# Patient Record
Sex: Female | Born: 1945 | Race: Asian | Hispanic: No | Marital: Married | Smoking: Never smoker
Health system: Southern US, Community
[De-identification: ages and names within clinical notes are randomized; demographics above are authoritative.]

## PROBLEM LIST (undated history)

## (undated) ENCOUNTER — Emergency Department (HOSPITAL_COMMUNITY): Discharge status: Absent without leave | Payer: Medicare Other

## (undated) DIAGNOSIS — Z853 Personal history of malignant neoplasm of breast: Secondary | ICD-10-CM

## (undated) DIAGNOSIS — Z923 Personal history of irradiation: Secondary | ICD-10-CM

## (undated) DIAGNOSIS — F419 Anxiety disorder, unspecified: Secondary | ICD-10-CM

## (undated) DIAGNOSIS — K219 Gastro-esophageal reflux disease without esophagitis: Secondary | ICD-10-CM

## (undated) DIAGNOSIS — Z8719 Personal history of other diseases of the digestive system: Secondary | ICD-10-CM

## (undated) DIAGNOSIS — Z9989 Dependence on other enabling machines and devices: Secondary | ICD-10-CM

## (undated) DIAGNOSIS — T7840XA Allergy, unspecified, initial encounter: Secondary | ICD-10-CM

## (undated) DIAGNOSIS — Z9221 Personal history of antineoplastic chemotherapy: Secondary | ICD-10-CM

## (undated) DIAGNOSIS — E042 Nontoxic multinodular goiter: Secondary | ICD-10-CM

## (undated) DIAGNOSIS — Z86010 Personal history of colonic polyps: Secondary | ICD-10-CM

## (undated) DIAGNOSIS — E785 Hyperlipidemia, unspecified: Secondary | ICD-10-CM

## (undated) DIAGNOSIS — Z860101 Personal history of adenomatous and serrated colon polyps: Secondary | ICD-10-CM

## (undated) DIAGNOSIS — R339 Retention of urine, unspecified: Secondary | ICD-10-CM

## (undated) DIAGNOSIS — G4733 Obstructive sleep apnea (adult) (pediatric): Secondary | ICD-10-CM

## (undated) DIAGNOSIS — Z8619 Personal history of other infectious and parasitic diseases: Secondary | ICD-10-CM

## (undated) DIAGNOSIS — G4730 Sleep apnea, unspecified: Secondary | ICD-10-CM

## (undated) DIAGNOSIS — M810 Age-related osteoporosis without current pathological fracture: Secondary | ICD-10-CM

## (undated) DIAGNOSIS — D509 Iron deficiency anemia, unspecified: Secondary | ICD-10-CM

## (undated) DIAGNOSIS — N819 Female genital prolapse, unspecified: Secondary | ICD-10-CM

## (undated) DIAGNOSIS — Z8679 Personal history of other diseases of the circulatory system: Secondary | ICD-10-CM

## (undated) DIAGNOSIS — R3915 Urgency of urination: Secondary | ICD-10-CM

## (undated) DIAGNOSIS — E039 Hypothyroidism, unspecified: Secondary | ICD-10-CM

## (undated) DIAGNOSIS — N3020 Other chronic cystitis without hematuria: Secondary | ICD-10-CM

## (undated) HISTORY — PX: CARPAL TUNNEL RELEASE: SHX101

## (undated) HISTORY — DX: Sleep apnea, unspecified: G47.30

## (undated) HISTORY — PX: CATARACT EXTRACTION W/ INTRAOCULAR LENS  IMPLANT, BILATERAL: SHX1307

## (undated) HISTORY — PX: OTHER SURGICAL HISTORY: SHX169

## (undated) HISTORY — DX: Personal history of colonic polyps: Z86.010

## (undated) HISTORY — DX: Personal history of antineoplastic chemotherapy: Z92.21

## (undated) HISTORY — DX: Allergy, unspecified, initial encounter: T78.40XA

## (undated) HISTORY — DX: Personal history of adenomatous and serrated colon polyps: Z86.0101

## (undated) HISTORY — DX: Personal history of irradiation: Z92.3

## (undated) HISTORY — DX: Other chronic cystitis without hematuria: N30.20

---

## 1968-09-21 HISTORY — PX: URETHRAL DILATION: SUR417

## 1978-01-21 HISTORY — PX: ABDOMINAL HYSTERECTOMY: SHX81

## 1997-05-03 ENCOUNTER — Ambulatory Visit (HOSPITAL_COMMUNITY): Admission: RE | Admit: 1997-05-03 | Discharge: 1997-05-03 | Payer: Self-pay | Admitting: Gynecology

## 1998-03-23 ENCOUNTER — Encounter: Payer: Self-pay | Admitting: Pulmonary Disease

## 1998-03-23 ENCOUNTER — Ambulatory Visit (HOSPITAL_COMMUNITY): Admission: RE | Admit: 1998-03-23 | Discharge: 1998-03-23 | Payer: Self-pay | Admitting: Pulmonary Disease

## 1999-04-03 ENCOUNTER — Encounter: Payer: Self-pay | Admitting: Gynecology

## 1999-04-03 ENCOUNTER — Ambulatory Visit (HOSPITAL_COMMUNITY): Admission: RE | Admit: 1999-04-03 | Discharge: 1999-04-03 | Payer: Self-pay | Admitting: Gynecology

## 2000-08-21 ENCOUNTER — Ambulatory Visit (HOSPITAL_COMMUNITY): Admission: RE | Admit: 2000-08-21 | Discharge: 2000-08-21 | Payer: Self-pay | Admitting: Gynecology

## 2000-08-21 ENCOUNTER — Encounter: Payer: Self-pay | Admitting: Gynecology

## 2000-12-17 ENCOUNTER — Other Ambulatory Visit: Admission: RE | Admit: 2000-12-17 | Discharge: 2000-12-17 | Payer: Self-pay | Admitting: Gynecology

## 2001-07-02 ENCOUNTER — Encounter: Admission: RE | Admit: 2001-07-02 | Discharge: 2001-07-02 | Payer: Self-pay | Admitting: Gastroenterology

## 2001-07-02 ENCOUNTER — Encounter: Payer: Self-pay | Admitting: Gastroenterology

## 2001-11-17 ENCOUNTER — Ambulatory Visit (HOSPITAL_COMMUNITY): Admission: RE | Admit: 2001-11-17 | Discharge: 2001-11-17 | Payer: Self-pay | Admitting: Internal Medicine

## 2001-11-17 ENCOUNTER — Encounter: Payer: Self-pay | Admitting: Internal Medicine

## 2001-12-11 ENCOUNTER — Encounter: Payer: Self-pay | Admitting: Gastroenterology

## 2002-03-18 ENCOUNTER — Other Ambulatory Visit: Admission: RE | Admit: 2002-03-18 | Discharge: 2002-03-18 | Payer: Self-pay | Admitting: Gynecology

## 2002-09-08 ENCOUNTER — Encounter: Payer: Self-pay | Admitting: Sports Medicine

## 2002-09-08 ENCOUNTER — Encounter: Admission: RE | Admit: 2002-09-08 | Discharge: 2002-09-08 | Payer: Self-pay | Admitting: Sports Medicine

## 2003-01-27 ENCOUNTER — Ambulatory Visit (HOSPITAL_COMMUNITY): Admission: RE | Admit: 2003-01-27 | Discharge: 2003-01-27 | Payer: Self-pay | Admitting: Gynecology

## 2003-03-21 ENCOUNTER — Other Ambulatory Visit: Admission: RE | Admit: 2003-03-21 | Discharge: 2003-03-21 | Payer: Self-pay | Admitting: Gynecology

## 2003-09-20 ENCOUNTER — Ambulatory Visit (HOSPITAL_COMMUNITY): Admission: RE | Admit: 2003-09-20 | Discharge: 2003-09-20 | Payer: Self-pay | Admitting: Gastroenterology

## 2003-12-29 ENCOUNTER — Ambulatory Visit: Payer: Self-pay | Admitting: Internal Medicine

## 2004-03-12 ENCOUNTER — Ambulatory Visit: Payer: Self-pay | Admitting: Internal Medicine

## 2004-03-15 ENCOUNTER — Ambulatory Visit (HOSPITAL_COMMUNITY): Admission: RE | Admit: 2004-03-15 | Discharge: 2004-03-15 | Payer: Self-pay | Admitting: Gynecology

## 2004-03-20 ENCOUNTER — Ambulatory Visit: Payer: Self-pay | Admitting: Internal Medicine

## 2004-03-21 ENCOUNTER — Other Ambulatory Visit: Admission: RE | Admit: 2004-03-21 | Discharge: 2004-03-21 | Payer: Self-pay | Admitting: Gynecology

## 2004-04-12 ENCOUNTER — Ambulatory Visit: Payer: Self-pay | Admitting: Internal Medicine

## 2004-05-01 ENCOUNTER — Ambulatory Visit: Payer: Self-pay | Admitting: Internal Medicine

## 2004-08-11 ENCOUNTER — Encounter: Admission: RE | Admit: 2004-08-11 | Discharge: 2004-08-11 | Payer: Self-pay | Admitting: Orthopedic Surgery

## 2004-09-06 ENCOUNTER — Ambulatory Visit: Payer: Self-pay | Admitting: Internal Medicine

## 2004-11-06 ENCOUNTER — Ambulatory Visit: Payer: Self-pay | Admitting: Internal Medicine

## 2004-11-26 ENCOUNTER — Ambulatory Visit: Payer: Self-pay | Admitting: Internal Medicine

## 2005-03-22 ENCOUNTER — Ambulatory Visit: Payer: Self-pay | Admitting: Internal Medicine

## 2005-03-28 ENCOUNTER — Ambulatory Visit: Payer: Self-pay | Admitting: Internal Medicine

## 2005-03-29 ENCOUNTER — Ambulatory Visit (HOSPITAL_COMMUNITY): Admission: RE | Admit: 2005-03-29 | Discharge: 2005-03-29 | Payer: Self-pay | Admitting: Obstetrics & Gynecology

## 2005-06-10 ENCOUNTER — Ambulatory Visit: Payer: Self-pay | Admitting: Internal Medicine

## 2005-06-22 ENCOUNTER — Encounter: Payer: Self-pay | Admitting: Pulmonary Disease

## 2005-07-08 ENCOUNTER — Ambulatory Visit: Payer: Self-pay | Admitting: Endocrinology

## 2005-07-17 ENCOUNTER — Ambulatory Visit (HOSPITAL_COMMUNITY): Admission: RE | Admit: 2005-07-17 | Discharge: 2005-07-17 | Payer: Self-pay | Admitting: Endocrinology

## 2005-07-29 ENCOUNTER — Ambulatory Visit: Payer: Self-pay | Admitting: Endocrinology

## 2005-08-01 ENCOUNTER — Ambulatory Visit: Payer: Self-pay | Admitting: Endocrinology

## 2005-08-06 ENCOUNTER — Ambulatory Visit: Payer: Self-pay | Admitting: Endocrinology

## 2005-08-29 ENCOUNTER — Ambulatory Visit: Payer: Self-pay | Admitting: Endocrinology

## 2005-09-09 ENCOUNTER — Ambulatory Visit: Payer: Self-pay

## 2005-09-09 ENCOUNTER — Encounter: Payer: Self-pay | Admitting: Cardiovascular Disease

## 2005-09-10 ENCOUNTER — Ambulatory Visit: Payer: Self-pay | Admitting: Endocrinology

## 2005-10-30 ENCOUNTER — Ambulatory Visit: Payer: Self-pay | Admitting: Gastroenterology

## 2005-11-04 ENCOUNTER — Ambulatory Visit: Payer: Self-pay | Admitting: Endocrinology

## 2005-11-04 LAB — CONVERTED CEMR LAB
ALT: 22 units/L (ref 0–40)
Albumin: 3.6 g/dL (ref 3.5–5.2)
CO2: 32 meq/L (ref 19–32)
Calcium: 9.1 mg/dL (ref 8.4–10.5)
Chloride: 105 meq/L (ref 96–112)
Chol/HDL Ratio, serum: 2.4
Creatinine, Ser: 0.9 mg/dL (ref 0.4–1.2)
Glucose, Bld: 110 mg/dL — ABNORMAL HIGH (ref 70–99)
LDL Cholesterol: 56 mg/dL (ref 0–99)
Potassium: 4 meq/L (ref 3.5–5.1)
Sodium: 143 meq/L (ref 135–145)
Total Bilirubin: 0.7 mg/dL (ref 0.3–1.2)
VLDL: 15 mg/dL (ref 0–40)

## 2005-11-06 ENCOUNTER — Ambulatory Visit: Payer: Self-pay | Admitting: Endocrinology

## 2006-01-16 ENCOUNTER — Ambulatory Visit: Payer: Self-pay | Admitting: Cardiology

## 2006-02-03 ENCOUNTER — Ambulatory Visit: Payer: Self-pay | Admitting: Gastroenterology

## 2006-02-03 LAB — CONVERTED CEMR LAB
Fecal Occult Blood: NEGATIVE
OCCULT 1: NEGATIVE
OCCULT 2: NEGATIVE
OCCULT 3: NEGATIVE
OCCULT 4: NEGATIVE
OCCULT 5: NEGATIVE

## 2006-03-13 ENCOUNTER — Ambulatory Visit: Payer: Self-pay | Admitting: Cardiology

## 2006-03-13 ENCOUNTER — Ambulatory Visit: Payer: Self-pay

## 2006-03-13 LAB — CONVERTED CEMR LAB
BUN: 12 mg/dL (ref 6–23)
Calcium: 9.6 mg/dL (ref 8.4–10.5)
GFR calc Af Amer: 109 mL/min
GFR calc non Af Amer: 90 mL/min
Glucose, Bld: 95 mg/dL (ref 70–99)
Magnesium: 2.1 mg/dL (ref 1.5–2.5)
Potassium: 3.7 meq/L (ref 3.5–5.1)
Sodium: 142 meq/L (ref 135–145)

## 2006-04-10 ENCOUNTER — Ambulatory Visit (HOSPITAL_COMMUNITY): Admission: RE | Admit: 2006-04-10 | Discharge: 2006-04-10 | Payer: Self-pay | Admitting: Obstetrics & Gynecology

## 2006-04-14 ENCOUNTER — Ambulatory Visit: Payer: Self-pay | Admitting: Cardiology

## 2006-04-14 ENCOUNTER — Ambulatory Visit: Payer: Self-pay

## 2006-04-14 ENCOUNTER — Encounter: Payer: Self-pay | Admitting: Internal Medicine

## 2006-05-12 ENCOUNTER — Ambulatory Visit: Payer: Self-pay | Admitting: Gastroenterology

## 2006-06-24 ENCOUNTER — Ambulatory Visit: Payer: Self-pay | Admitting: Internal Medicine

## 2006-06-24 LAB — CONVERTED CEMR LAB
Basophils Absolute: 0 K/uL
Basophils Relative: 0.3 %
Eosinophils Absolute: 0.3 K/uL
Eosinophils Relative: 3 %
HCT: 34.9 % — ABNORMAL LOW
Hemoglobin: 12.2 g/dL
Iron: 80 ug/dL
Lymphocytes Relative: 40.9 %
MCHC: 34.9 g/dL
MCV: 90.3 fL
Monocytes Absolute: 0.7 K/uL
Monocytes Relative: 7.7 %
Neutro Abs: 4.7 K/uL
Neutrophils Relative %: 48.1 %
Platelets: 289 K/uL
RBC: 3.86 M/uL — ABNORMAL LOW
RDW: 13.2 %
Saturation Ratios: 20.2 %
Transferrin: 282.8 mg/dL
Vitamin B-12: 483 pg/mL
WBC: 9.6 10*3/microliter

## 2006-07-07 ENCOUNTER — Ambulatory Visit: Payer: Self-pay | Admitting: Internal Medicine

## 2006-07-09 ENCOUNTER — Ambulatory Visit: Payer: Self-pay | Admitting: Internal Medicine

## 2006-07-09 ENCOUNTER — Encounter: Payer: Self-pay | Admitting: Internal Medicine

## 2006-07-14 ENCOUNTER — Ambulatory Visit: Payer: Self-pay | Admitting: Endocrinology

## 2006-09-25 ENCOUNTER — Ambulatory Visit: Payer: Self-pay | Admitting: Endocrinology

## 2006-09-25 LAB — CONVERTED CEMR LAB
ALT: 27 units/L (ref 0–35)
AST: 23 units/L (ref 0–37)
Albumin: 3.8 g/dL (ref 3.5–5.2)
Basophils Absolute: 0 10*3/uL (ref 0.0–0.1)
Bilirubin Urine: NEGATIVE
Bilirubin, Direct: 0.1 mg/dL (ref 0.0–0.3)
Cholesterol: 125 mg/dL (ref 0–200)
Crystals: NEGATIVE
Eosinophils Absolute: 0.3 10*3/uL (ref 0.0–0.6)
GFR calc non Af Amer: 90 mL/min
Glucose, Bld: 114 mg/dL — ABNORMAL HIGH (ref 70–99)
HCT: 36.9 % (ref 36.0–46.0)
Hemoglobin: 12.8 g/dL (ref 12.0–15.0)
LDL Cholesterol: 57 mg/dL (ref 0–99)
Lymphocytes Relative: 42.6 % (ref 12.0–46.0)
MCHC: 34.6 g/dL (ref 30.0–36.0)
MCV: 92.5 fL (ref 78.0–100.0)
Monocytes Absolute: 0.7 10*3/uL (ref 0.2–0.7)
Monocytes Relative: 7.8 % (ref 3.0–11.0)
Neutro Abs: 4.1 10*3/uL (ref 1.4–7.7)
PTH: 40.2 pg/mL (ref 14.0–72.0)
Potassium: 3.8 meq/L (ref 3.5–5.1)
Specific Gravity, Urine: 1.015 (ref 1.000–1.03)
Total Bilirubin: 0.9 mg/dL (ref 0.3–1.2)
Urobilinogen, UA: 0.2 (ref 0.0–1.0)
VLDL: 18 mg/dL (ref 0–40)
WBC: 8.9 10*3/uL (ref 4.5–10.5)
pH: 6.5 (ref 5.0–8.0)

## 2006-10-09 ENCOUNTER — Ambulatory Visit: Payer: Self-pay | Admitting: Endocrinology

## 2006-12-22 ENCOUNTER — Telehealth: Payer: Self-pay | Admitting: Endocrinology

## 2006-12-24 ENCOUNTER — Encounter: Payer: Self-pay | Admitting: Pulmonary Disease

## 2007-04-29 ENCOUNTER — Encounter: Payer: Self-pay | Admitting: Internal Medicine

## 2007-06-11 ENCOUNTER — Ambulatory Visit (HOSPITAL_COMMUNITY): Admission: RE | Admit: 2007-06-11 | Discharge: 2007-06-11 | Payer: Self-pay | Admitting: Obstetrics & Gynecology

## 2007-06-13 DIAGNOSIS — F329 Major depressive disorder, single episode, unspecified: Secondary | ICD-10-CM | POA: Insufficient documentation

## 2007-06-13 DIAGNOSIS — IMO0001 Reserved for inherently not codable concepts without codable children: Secondary | ICD-10-CM | POA: Insufficient documentation

## 2007-06-13 DIAGNOSIS — I10 Essential (primary) hypertension: Secondary | ICD-10-CM | POA: Insufficient documentation

## 2007-06-13 DIAGNOSIS — K219 Gastro-esophageal reflux disease without esophagitis: Secondary | ICD-10-CM | POA: Insufficient documentation

## 2007-06-13 DIAGNOSIS — G5600 Carpal tunnel syndrome, unspecified upper limb: Secondary | ICD-10-CM | POA: Insufficient documentation

## 2007-06-13 DIAGNOSIS — K5730 Diverticulosis of large intestine without perforation or abscess without bleeding: Secondary | ICD-10-CM | POA: Insufficient documentation

## 2007-06-13 DIAGNOSIS — E785 Hyperlipidemia, unspecified: Secondary | ICD-10-CM | POA: Insufficient documentation

## 2007-06-13 DIAGNOSIS — D126 Benign neoplasm of colon, unspecified: Secondary | ICD-10-CM | POA: Insufficient documentation

## 2007-06-13 DIAGNOSIS — F3289 Other specified depressive episodes: Secondary | ICD-10-CM | POA: Insufficient documentation

## 2007-06-13 DIAGNOSIS — D509 Iron deficiency anemia, unspecified: Secondary | ICD-10-CM | POA: Insufficient documentation

## 2007-06-13 DIAGNOSIS — J45909 Unspecified asthma, uncomplicated: Secondary | ICD-10-CM | POA: Insufficient documentation

## 2007-06-13 DIAGNOSIS — K449 Diaphragmatic hernia without obstruction or gangrene: Secondary | ICD-10-CM | POA: Insufficient documentation

## 2007-06-13 DIAGNOSIS — E039 Hypothyroidism, unspecified: Secondary | ICD-10-CM | POA: Insufficient documentation

## 2007-06-26 ENCOUNTER — Ambulatory Visit: Payer: Self-pay | Admitting: Internal Medicine

## 2007-06-26 DIAGNOSIS — E042 Nontoxic multinodular goiter: Secondary | ICD-10-CM | POA: Insufficient documentation

## 2007-06-26 DIAGNOSIS — E739 Lactose intolerance, unspecified: Secondary | ICD-10-CM | POA: Insufficient documentation

## 2007-06-26 DIAGNOSIS — F411 Generalized anxiety disorder: Secondary | ICD-10-CM | POA: Insufficient documentation

## 2007-06-26 DIAGNOSIS — J309 Allergic rhinitis, unspecified: Secondary | ICD-10-CM | POA: Insufficient documentation

## 2007-06-29 ENCOUNTER — Encounter (INDEPENDENT_AMBULATORY_CARE_PROVIDER_SITE_OTHER): Payer: Self-pay | Admitting: *Deleted

## 2007-07-15 ENCOUNTER — Ambulatory Visit: Payer: Self-pay | Admitting: Pulmonary Disease

## 2007-07-16 ENCOUNTER — Encounter: Payer: Self-pay | Admitting: Pulmonary Disease

## 2007-07-16 ENCOUNTER — Ambulatory Visit (HOSPITAL_BASED_OUTPATIENT_CLINIC_OR_DEPARTMENT_OTHER): Admission: RE | Admit: 2007-07-16 | Discharge: 2007-07-16 | Payer: Self-pay | Admitting: Pulmonary Disease

## 2007-07-17 ENCOUNTER — Encounter: Payer: Self-pay | Admitting: Cardiology

## 2007-07-29 ENCOUNTER — Telehealth: Payer: Self-pay | Admitting: Pulmonary Disease

## 2007-08-04 ENCOUNTER — Telehealth (INDEPENDENT_AMBULATORY_CARE_PROVIDER_SITE_OTHER): Payer: Self-pay | Admitting: *Deleted

## 2007-08-05 ENCOUNTER — Ambulatory Visit: Payer: Self-pay | Admitting: Pulmonary Disease

## 2007-08-06 ENCOUNTER — Ambulatory Visit: Payer: Self-pay | Admitting: Pulmonary Disease

## 2007-08-06 DIAGNOSIS — G4733 Obstructive sleep apnea (adult) (pediatric): Secondary | ICD-10-CM | POA: Insufficient documentation

## 2007-08-10 ENCOUNTER — Telehealth (INDEPENDENT_AMBULATORY_CARE_PROVIDER_SITE_OTHER): Payer: Self-pay | Admitting: *Deleted

## 2007-08-17 ENCOUNTER — Telehealth: Payer: Self-pay | Admitting: Pulmonary Disease

## 2007-09-06 ENCOUNTER — Encounter: Payer: Self-pay | Admitting: Pulmonary Disease

## 2007-09-09 ENCOUNTER — Ambulatory Visit: Payer: Self-pay | Admitting: Pulmonary Disease

## 2007-09-14 ENCOUNTER — Encounter: Payer: Self-pay | Admitting: Endocrinology

## 2007-09-22 ENCOUNTER — Telehealth: Payer: Self-pay | Admitting: Pulmonary Disease

## 2007-10-09 ENCOUNTER — Encounter: Payer: Self-pay | Admitting: Pulmonary Disease

## 2007-11-03 ENCOUNTER — Telehealth: Payer: Self-pay | Admitting: Internal Medicine

## 2007-11-06 ENCOUNTER — Telehealth: Payer: Self-pay | Admitting: Internal Medicine

## 2007-11-09 ENCOUNTER — Encounter: Payer: Self-pay | Admitting: Pulmonary Disease

## 2007-11-10 ENCOUNTER — Telehealth (INDEPENDENT_AMBULATORY_CARE_PROVIDER_SITE_OTHER): Payer: Self-pay | Admitting: *Deleted

## 2007-11-12 ENCOUNTER — Ambulatory Visit: Payer: Self-pay | Admitting: Internal Medicine

## 2007-11-12 ENCOUNTER — Ambulatory Visit: Payer: Self-pay | Admitting: Endocrinology

## 2007-11-12 LAB — CONVERTED CEMR LAB
BUN: 14 mg/dL (ref 6–23)
Creatinine, Ser: 0.7 mg/dL (ref 0.4–1.2)

## 2007-11-14 ENCOUNTER — Encounter: Payer: Self-pay | Admitting: Pulmonary Disease

## 2007-11-20 ENCOUNTER — Encounter: Payer: Self-pay | Admitting: Pulmonary Disease

## 2007-11-23 ENCOUNTER — Telehealth: Payer: Self-pay | Admitting: Internal Medicine

## 2007-12-01 ENCOUNTER — Ambulatory Visit: Payer: Self-pay | Admitting: Internal Medicine

## 2007-12-01 ENCOUNTER — Ambulatory Visit (HOSPITAL_COMMUNITY): Admission: RE | Admit: 2007-12-01 | Discharge: 2007-12-01 | Payer: Self-pay | Admitting: Internal Medicine

## 2007-12-02 ENCOUNTER — Ambulatory Visit: Payer: Self-pay | Admitting: Pulmonary Disease

## 2007-12-09 ENCOUNTER — Ambulatory Visit: Payer: Self-pay | Admitting: Endocrinology

## 2007-12-09 LAB — CONVERTED CEMR LAB
ALT: 23 units/L (ref 0–35)
AST: 22 units/L (ref 0–37)
Alkaline Phosphatase: 43 units/L (ref 39–117)
BUN: 11 mg/dL (ref 6–23)
Basophils Absolute: 0 10*3/uL (ref 0.0–0.1)
Calcium: 9.2 mg/dL (ref 8.4–10.5)
Chloride: 105 meq/L (ref 96–112)
Cholesterol: 112 mg/dL (ref 0–200)
Crystals: NEGATIVE
Eosinophils Absolute: 0.3 10*3/uL (ref 0.0–0.7)
Eosinophils Relative: 3.5 % (ref 0.0–5.0)
GFR calc non Af Amer: 90 mL/min
Glucose, Bld: 106 mg/dL — ABNORMAL HIGH (ref 70–99)
Ketones, ur: NEGATIVE mg/dL
LDL Cholesterol: 46 mg/dL (ref 0–99)
Lymphocytes Relative: 38.4 % (ref 12.0–46.0)
MCV: 93.4 fL (ref 78.0–100.0)
Mucus, UA: NEGATIVE
Neutro Abs: 4.1 10*3/uL (ref 1.4–7.7)
Neutrophils Relative %: 48.9 % (ref 43.0–77.0)
Potassium: 3.6 meq/L (ref 3.5–5.1)
RBC: 3.82 M/uL — ABNORMAL LOW (ref 3.87–5.11)
RDW: 13.7 % (ref 11.5–14.6)
Sodium: 142 meq/L (ref 135–145)
Specific Gravity, Urine: 1.02 (ref 1.000–1.03)
Squamous Epithelial / LPF: NEGATIVE /lpf
Total Bilirubin: 0.7 mg/dL (ref 0.3–1.2)
Total Protein: 7 g/dL (ref 6.0–8.3)
Triglycerides: 62 mg/dL (ref 0–149)
Urine Glucose: NEGATIVE mg/dL
Urobilinogen, UA: 0.2 (ref 0.0–1.0)
VLDL: 12 mg/dL (ref 0–40)

## 2007-12-14 ENCOUNTER — Ambulatory Visit: Payer: Self-pay | Admitting: Pulmonary Disease

## 2007-12-23 ENCOUNTER — Encounter: Payer: Self-pay | Admitting: Pulmonary Disease

## 2007-12-23 ENCOUNTER — Telehealth (INDEPENDENT_AMBULATORY_CARE_PROVIDER_SITE_OTHER): Payer: Self-pay | Admitting: *Deleted

## 2007-12-23 ENCOUNTER — Ambulatory Visit (HOSPITAL_COMMUNITY): Admission: RE | Admit: 2007-12-23 | Discharge: 2007-12-23 | Payer: Self-pay | Admitting: Pulmonary Disease

## 2007-12-24 ENCOUNTER — Ambulatory Visit: Payer: Self-pay | Admitting: Endocrinology

## 2007-12-24 ENCOUNTER — Ambulatory Visit: Payer: Self-pay | Admitting: Pulmonary Disease

## 2007-12-24 DIAGNOSIS — N3020 Other chronic cystitis without hematuria: Secondary | ICD-10-CM | POA: Insufficient documentation

## 2008-05-24 ENCOUNTER — Encounter: Payer: Self-pay | Admitting: Endocrinology

## 2008-06-06 ENCOUNTER — Telehealth: Payer: Self-pay | Admitting: Pulmonary Disease

## 2008-06-08 ENCOUNTER — Encounter: Payer: Self-pay | Admitting: Endocrinology

## 2008-06-13 ENCOUNTER — Ambulatory Visit: Payer: Self-pay | Admitting: Pulmonary Disease

## 2008-06-15 ENCOUNTER — Ambulatory Visit: Payer: Self-pay | Admitting: Pulmonary Disease

## 2008-06-15 ENCOUNTER — Ambulatory Visit: Admission: RE | Admit: 2008-06-15 | Discharge: 2008-06-15 | Payer: Self-pay | Admitting: Pulmonary Disease

## 2008-07-14 ENCOUNTER — Encounter: Payer: Self-pay | Admitting: Pulmonary Disease

## 2008-09-14 ENCOUNTER — Ambulatory Visit: Payer: Self-pay | Admitting: Pulmonary Disease

## 2008-09-28 ENCOUNTER — Ambulatory Visit: Payer: Self-pay | Admitting: Endocrinology

## 2008-10-03 ENCOUNTER — Telehealth: Payer: Self-pay | Admitting: Pulmonary Disease

## 2008-10-03 ENCOUNTER — Encounter: Payer: Self-pay | Admitting: Pulmonary Disease

## 2008-11-02 ENCOUNTER — Ambulatory Visit: Payer: Self-pay | Admitting: Endocrinology

## 2008-11-17 ENCOUNTER — Telehealth: Payer: Self-pay | Admitting: Internal Medicine

## 2008-11-18 ENCOUNTER — Ambulatory Visit: Payer: Self-pay | Admitting: Endocrinology

## 2008-11-18 DIAGNOSIS — R51 Headache: Secondary | ICD-10-CM | POA: Insufficient documentation

## 2008-11-18 DIAGNOSIS — M810 Age-related osteoporosis without current pathological fracture: Secondary | ICD-10-CM | POA: Insufficient documentation

## 2008-11-18 DIAGNOSIS — R519 Headache, unspecified: Secondary | ICD-10-CM | POA: Insufficient documentation

## 2008-11-18 LAB — CONVERTED CEMR LAB
Albumin: 3.9 g/dL (ref 3.5–5.2)
Alkaline Phosphatase: 61 units/L (ref 39–117)
BUN: 17 mg/dL (ref 6–23)
Basophils Relative: 0.9 % (ref 0.0–3.0)
Calcium: 9.1 mg/dL (ref 8.4–10.5)
Eosinophils Absolute: 0.2 10*3/uL (ref 0.0–0.7)
GFR calc non Af Amer: 89.15 mL/min (ref >60)
Glucose, Bld: 129 mg/dL — ABNORMAL HIGH (ref 70–99)
HCT: 36.2 % (ref 36.0–46.0)
Ketones, ur: NEGATIVE mg/dL
LDL Cholesterol: 104 mg/dL — ABNORMAL HIGH (ref 0–99)
Lymphocytes Relative: 40.4 % (ref 12.0–46.0)
MCHC: 34.6 g/dL (ref 30.0–36.0)
MCV: 93.7 fL (ref 78.0–100.0)
Monocytes Relative: 6.3 % (ref 3.0–12.0)
Neutrophils Relative %: 49.8 % (ref 43.0–77.0)
PTH: 24.3 pg/mL (ref 14.0–72.0)
Platelets: 292 10*3/uL (ref 150.0–400.0)
RBC: 3.86 M/uL — ABNORMAL LOW (ref 3.87–5.11)
RDW: 13.3 % (ref 11.5–14.6)
Specific Gravity, Urine: 1.01 (ref 1.000–1.030)
Total Bilirubin: 0.6 mg/dL (ref 0.3–1.2)
Total CHOL/HDL Ratio: 4
Total CK: 244 units/L — ABNORMAL HIGH (ref 7–177)
Transferrin: 274.2 mg/dL (ref 212.0–360.0)
Triglycerides: 136 mg/dL (ref 0.0–149.0)
Urine Glucose: NEGATIVE mg/dL
Urobilinogen, UA: 0.2 (ref 0.0–1.0)
pH: 6 (ref 5.0–8.0)

## 2008-11-23 ENCOUNTER — Telehealth: Payer: Self-pay | Admitting: Endocrinology

## 2008-11-28 ENCOUNTER — Telehealth: Payer: Self-pay | Admitting: Endocrinology

## 2009-01-03 ENCOUNTER — Telehealth: Payer: Self-pay | Admitting: Endocrinology

## 2009-01-03 DIAGNOSIS — R3129 Other microscopic hematuria: Secondary | ICD-10-CM | POA: Insufficient documentation

## 2009-01-17 ENCOUNTER — Encounter (INDEPENDENT_AMBULATORY_CARE_PROVIDER_SITE_OTHER): Payer: Self-pay | Admitting: *Deleted

## 2009-01-18 ENCOUNTER — Telehealth (INDEPENDENT_AMBULATORY_CARE_PROVIDER_SITE_OTHER): Payer: Self-pay | Admitting: *Deleted

## 2009-01-19 ENCOUNTER — Telehealth (INDEPENDENT_AMBULATORY_CARE_PROVIDER_SITE_OTHER): Payer: Self-pay | Admitting: *Deleted

## 2009-02-21 ENCOUNTER — Encounter: Payer: Self-pay | Admitting: Endocrinology

## 2009-02-22 ENCOUNTER — Encounter: Payer: Self-pay | Admitting: Endocrinology

## 2009-03-13 ENCOUNTER — Ambulatory Visit: Payer: Self-pay | Admitting: Endocrinology

## 2009-03-13 DIAGNOSIS — E876 Hypokalemia: Secondary | ICD-10-CM | POA: Insufficient documentation

## 2009-03-13 LAB — CONVERTED CEMR LAB
ALT: 39 units/L — ABNORMAL HIGH (ref 0–35)
Albumin: 3.7 g/dL (ref 3.5–5.2)
Chloride: 104 meq/L (ref 96–112)
Cholesterol: 142 mg/dL (ref 0–200)
Creatinine, Ser: 1 mg/dL (ref 0.4–1.2)
GFR calc non Af Amer: 59.01 mL/min (ref >60)
Glucose, Bld: 106 mg/dL — ABNORMAL HIGH (ref 70–99)
HDL: 64.5 mg/dL (ref >39.00)
LDL Cholesterol: 67 mg/dL (ref 0–99)
Potassium: 3.8 meq/L (ref 3.5–5.1)
Total Bilirubin: 0.6 mg/dL (ref 0.3–1.2)
Total CHOL/HDL Ratio: 2
Total CK: 959 units/L — ABNORMAL HIGH (ref 7–177)
VLDL: 10.4 mg/dL (ref 0.0–40.0)

## 2009-04-11 ENCOUNTER — Ambulatory Visit: Payer: Self-pay | Admitting: Endocrinology

## 2009-04-12 LAB — CONVERTED CEMR LAB
AST: 21 units/L (ref 0–37)
Alkaline Phosphatase: 56 units/L (ref 39–117)
Bilirubin, Direct: 0.1 mg/dL (ref 0.0–0.3)
Total Bilirubin: 0.3 mg/dL (ref 0.3–1.2)
Total Protein: 6.9 g/dL (ref 6.0–8.3)

## 2009-04-13 ENCOUNTER — Ambulatory Visit (HOSPITAL_COMMUNITY): Admission: RE | Admit: 2009-04-13 | Discharge: 2009-04-13 | Payer: Self-pay | Admitting: Obstetrics & Gynecology

## 2009-04-21 ENCOUNTER — Encounter: Admission: RE | Admit: 2009-04-21 | Discharge: 2009-04-21 | Payer: Self-pay | Admitting: Obstetrics & Gynecology

## 2009-04-21 HISTORY — PX: BREAST LUMPECTOMY: SHX2

## 2009-05-11 ENCOUNTER — Encounter: Admission: RE | Admit: 2009-05-11 | Discharge: 2009-05-11 | Payer: Self-pay | Admitting: Obstetrics & Gynecology

## 2009-05-15 ENCOUNTER — Encounter: Admission: RE | Admit: 2009-05-15 | Discharge: 2009-05-15 | Payer: Self-pay | Admitting: Obstetrics & Gynecology

## 2009-05-16 ENCOUNTER — Encounter: Payer: Self-pay | Admitting: Endocrinology

## 2009-05-17 ENCOUNTER — Encounter: Payer: Self-pay | Admitting: Endocrinology

## 2009-05-25 ENCOUNTER — Encounter: Payer: Self-pay | Admitting: Endocrinology

## 2009-05-25 ENCOUNTER — Encounter (INDEPENDENT_AMBULATORY_CARE_PROVIDER_SITE_OTHER): Payer: Self-pay | Admitting: *Deleted

## 2009-05-25 LAB — CONVERTED CEMR LAB
AST: 24 units/L
Calcium: 10 mg/dL
GFR calc Af Amer: >60 mL/min

## 2009-05-26 ENCOUNTER — Encounter (INDEPENDENT_AMBULATORY_CARE_PROVIDER_SITE_OTHER): Payer: Self-pay | Admitting: *Deleted

## 2009-05-29 HISTORY — PX: OTHER SURGICAL HISTORY: SHX169

## 2009-06-05 ENCOUNTER — Ambulatory Visit: Payer: Self-pay | Admitting: Oncology

## 2009-06-07 ENCOUNTER — Encounter: Payer: Self-pay | Admitting: Endocrinology

## 2009-06-07 LAB — COMPREHENSIVE METABOLIC PANEL
ALT: 15 U/L (ref 0–35)
AST: 19 U/L (ref 0–37)
Albumin: 4 g/dL (ref 3.5–5.2)
Alkaline Phosphatase: 60 U/L (ref 39–117)
BUN: 12 mg/dL (ref 6–23)
CO2: 24 mEq/L (ref 19–32)
Calcium: 9 mg/dL (ref 8.4–10.5)
Chloride: 102 mEq/L (ref 96–112)
Creatinine, Ser: 0.69 mg/dL (ref 0.40–1.20)
Glucose, Bld: 99 mg/dL (ref 70–99)
Potassium: 3.8 mEq/L (ref 3.5–5.3)
Sodium: 142 mEq/L (ref 135–145)
Total Bilirubin: 0.4 mg/dL (ref 0.3–1.2)
Total Protein: 6.9 g/dL (ref 6.0–8.3)

## 2009-06-07 LAB — CBC WITH DIFFERENTIAL/PLATELET
BASO%: 1.1 % (ref 0.0–2.0)
EOS%: 2.5 % (ref 0.0–7.0)
HCT: 35.1 % (ref 34.8–46.6)
LYMPH%: 36.4 % (ref 14.0–49.7)
MCH: 30.5 pg (ref 25.1–34.0)
MCHC: 33 g/dL (ref 31.5–36.0)
MONO%: 9.1 % (ref 0.0–14.0)
NEUT%: 50.9 % (ref 38.4–76.8)
Platelets: 276 10*3/uL (ref 145–400)
lymph#: 3.3 10*3/uL (ref 0.9–3.3)

## 2009-06-12 ENCOUNTER — Ambulatory Visit (HOSPITAL_COMMUNITY): Admission: RE | Admit: 2009-06-12 | Discharge: 2009-06-12 | Payer: Self-pay | Admitting: Oncology

## 2009-06-12 ENCOUNTER — Ambulatory Visit: Payer: Self-pay | Admitting: Endocrinology

## 2009-06-12 ENCOUNTER — Telehealth: Payer: Self-pay | Admitting: Endocrinology

## 2009-06-12 LAB — CONVERTED CEMR LAB: TSH: 2.14 microintl units/mL (ref 0.35–5.50)

## 2009-06-21 ENCOUNTER — Ambulatory Visit (HOSPITAL_COMMUNITY): Admission: RE | Admit: 2009-06-21 | Discharge: 2009-06-21 | Payer: Self-pay | Admitting: Oncology

## 2009-06-23 ENCOUNTER — Encounter: Payer: Self-pay | Admitting: Endocrinology

## 2009-06-26 ENCOUNTER — Telehealth: Payer: Self-pay | Admitting: Endocrinology

## 2009-07-03 ENCOUNTER — Encounter: Payer: Self-pay | Admitting: Endocrinology

## 2009-07-04 ENCOUNTER — Ambulatory Visit: Payer: Self-pay | Admitting: Pulmonary Disease

## 2009-07-04 DIAGNOSIS — C50919 Malignant neoplasm of unspecified site of unspecified female breast: Secondary | ICD-10-CM | POA: Insufficient documentation

## 2009-07-05 ENCOUNTER — Ambulatory Visit: Payer: Self-pay | Admitting: Oncology

## 2009-07-11 ENCOUNTER — Encounter: Payer: Self-pay | Admitting: Endocrinology

## 2009-07-13 ENCOUNTER — Encounter: Payer: Self-pay | Admitting: Endocrinology

## 2009-07-13 ENCOUNTER — Telehealth: Payer: Self-pay | Admitting: Endocrinology

## 2009-07-13 ENCOUNTER — Ambulatory Visit (HOSPITAL_COMMUNITY): Admission: RE | Admit: 2009-07-13 | Discharge: 2009-07-13 | Payer: Self-pay | Admitting: Oncology

## 2009-07-13 ENCOUNTER — Telehealth: Payer: Self-pay | Admitting: Internal Medicine

## 2009-07-13 LAB — CBC WITH DIFFERENTIAL/PLATELET
Eosinophils Absolute: 0.2 10*3/uL (ref 0.0–0.5)
MONO#: 0.6 10*3/uL (ref 0.1–0.9)
NEUT#: 0 10*3/uL — CL (ref 1.5–6.5)
RBC: 3.97 10*6/uL (ref 3.70–5.45)
RDW: 13.8 % (ref 11.2–14.5)
WBC: 2.5 10*3/uL — ABNORMAL LOW (ref 3.9–10.3)
lymph#: 1.8 10*3/uL (ref 0.9–3.3)
nRBC: 0 % (ref 0–0)

## 2009-07-13 LAB — BASIC METABOLIC PANEL
Chloride: 99 mEq/L (ref 96–112)
Creatinine, Ser: 0.78 mg/dL (ref 0.40–1.20)
Potassium: 3 mEq/L — ABNORMAL LOW (ref 3.5–5.3)
Sodium: 136 mEq/L (ref 135–145)

## 2009-07-14 ENCOUNTER — Encounter: Payer: Self-pay | Admitting: Internal Medicine

## 2009-07-14 LAB — MANUAL DIFFERENTIAL
Band Neutrophils: 15 % — ABNORMAL HIGH (ref 0–10)
EOS: 1 % (ref 0–7)
Other Cell: 0 % (ref 0–0)
PROMYELO: 0 % (ref 0–0)
SEG: 10 % — ABNORMAL LOW (ref 38–77)
nRBC: 3 % — ABNORMAL HIGH (ref 0–0)

## 2009-07-14 LAB — CBC WITH DIFFERENTIAL/PLATELET
HCT: 35.6 % (ref 34.8–46.6)
HGB: 12.1 g/dL (ref 11.6–15.9)
MCH: 30.1 pg (ref 25.1–34.0)
MCV: 88.6 fL (ref 79.5–101.0)
Platelets: 205 10*3/uL (ref 145–400)
nRBC: 1 % — ABNORMAL HIGH (ref 0–0)

## 2009-07-19 LAB — CULTURE, BLOOD (SINGLE)

## 2009-07-28 ENCOUNTER — Encounter: Payer: Self-pay | Admitting: Endocrinology

## 2009-07-28 LAB — COMPREHENSIVE METABOLIC PANEL
ALT: 30 U/L (ref 0–35)
AST: 27 U/L (ref 0–37)
Alkaline Phosphatase: 53 U/L (ref 39–117)
BUN: 18 mg/dL (ref 6–23)
Creatinine, Ser: 0.72 mg/dL (ref 0.40–1.20)
Potassium: 4 mEq/L (ref 3.5–5.3)

## 2009-07-28 LAB — CBC WITH DIFFERENTIAL/PLATELET
Basophils Absolute: 0 10*3/uL (ref 0.0–0.1)
EOS%: 0 % (ref 0.0–7.0)
HCT: 33.2 % — ABNORMAL LOW (ref 34.8–46.6)
HGB: 11.1 g/dL — ABNORMAL LOW (ref 11.6–15.9)
LYMPH%: 10.1 % — ABNORMAL LOW (ref 14.0–49.7)
MCH: 30.1 pg (ref 25.1–34.0)
MCHC: 33.4 g/dL (ref 31.5–36.0)
MCV: 90 fL (ref 79.5–101.0)
MONO%: 1.9 % (ref 0.0–14.0)
NEUT%: 87.9 % — ABNORMAL HIGH (ref 38.4–76.8)

## 2009-07-30 IMAGING — NM NM HEPATO W/GB/PHARM/[PERSON_NAME]
2 series · 12 of 12 positions shown · non-contrast
Comparison: 09/20/2003 hepatobiliary scan

CLINICAL DATA: NUCLEAR MEDICINE HEPATOBILIARY IMAGING WITH GALLBLADDER EF
TECHNIQUE: Sequential images of the abdomen were obtained [DATE]
minutes following intravenous administration of
radiopharmaceutical.  After slow intravenous infusion of  uCg
Cholecystokinin, gallbladder ejection fraction was determined.

Radiopharmaceutical:  5.4 mCi Tc-77m Choletec
1.6 mcg of CCK IV

[hepato · 2.40mm/px · 6 of 30 frames shown (1 of 2)]
[frame 3/30]
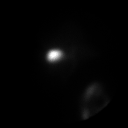
[frame 8/30]
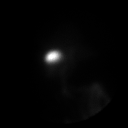
[frame 13/30]
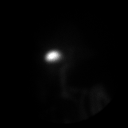
[frame 18/30]
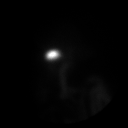
[frame 23/30]
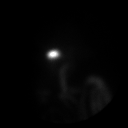
[frame 28/30]
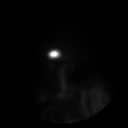

[hepato · 2.40mm/px · 6 of 60 frames shown (2 of 2)]
[frame 6/60]
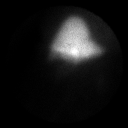
[frame 16/60]
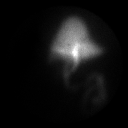
[frame 26/60]
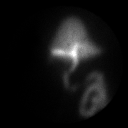
[frame 36/60]
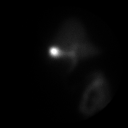
[frame 46/60]
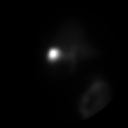
[frame 56/60]
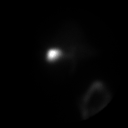

[12 of 12 positions shown; findings below may reference images not displayed]

FINDINGS: Prompt visualization of the biliary ducts and
gallbladder.  Tracer than enters the small bowel.  The gallbladder
ejection fraction is 58.3%.  Normal value is greater than 30%.

The patient did not experience symptoms during CCK infusion.
IMPRESSION: Patency of the biliary and cystic ducts.  Gallbladder ejection
fraction is 58%.

## 2009-07-31 ENCOUNTER — Telehealth: Payer: Self-pay | Admitting: Endocrinology

## 2009-08-01 ENCOUNTER — Ambulatory Visit: Payer: Self-pay | Admitting: Endocrinology

## 2009-08-02 ENCOUNTER — Encounter: Payer: Self-pay | Admitting: Endocrinology

## 2009-08-04 ENCOUNTER — Ambulatory Visit: Payer: Self-pay | Admitting: Oncology

## 2009-08-04 ENCOUNTER — Encounter: Payer: Self-pay | Admitting: Endocrinology

## 2009-08-04 LAB — CBC WITH DIFFERENTIAL/PLATELET
MCH: 29.9 pg (ref 25.1–34.0)
MCHC: 33.2 g/dL (ref 31.5–36.0)
RBC: 3.64 10*6/uL — ABNORMAL LOW (ref 3.70–5.45)
RDW: 15.6 % — ABNORMAL HIGH (ref 11.2–14.5)
nRBC: 1 % — ABNORMAL HIGH (ref 0–0)

## 2009-08-04 LAB — MANUAL DIFFERENTIAL
Band Neutrophils: 0 % (ref 0–10)
Basophil: 0 % (ref 0–2)
EOS: 7 % (ref 0–7)
Metamyelocytes: 0 % (ref 0–0)
Myelocytes: 0 % (ref 0–0)
PLT EST: ADEQUATE
PROMYELO: 0 % (ref 0–0)
nRBC: 0 % (ref 0–0)

## 2009-08-18 ENCOUNTER — Encounter: Payer: Self-pay | Admitting: Endocrinology

## 2009-08-18 LAB — URINALYSIS, MICROSCOPIC - CHCC
Ketones: NEGATIVE mg/dL
Nitrite: NEGATIVE
Protein: NEGATIVE mg/dL
pH: 7 (ref 4.6–8.0)

## 2009-08-18 LAB — COMPREHENSIVE METABOLIC PANEL
Albumin: 4.2 g/dL (ref 3.5–5.2)
BUN: 13 mg/dL (ref 6–23)
CO2: 19 mEq/L (ref 19–32)
Glucose, Bld: 121 mg/dL — ABNORMAL HIGH (ref 70–99)
Potassium: 4.2 mEq/L (ref 3.5–5.3)
Sodium: 141 mEq/L (ref 135–145)
Total Bilirubin: 0.4 mg/dL (ref 0.3–1.2)
Total Protein: 6.7 g/dL (ref 6.0–8.3)

## 2009-08-18 LAB — CBC WITH DIFFERENTIAL/PLATELET
BASO%: 0.4 % (ref 0.0–2.0)
Basophils Absolute: 0.1 10*3/uL (ref 0.0–0.1)
EOS%: 0 % (ref 0.0–7.0)
HGB: 10.5 g/dL — ABNORMAL LOW (ref 11.6–15.9)
MCH: 31.4 pg (ref 25.1–34.0)
MCHC: 34.1 g/dL (ref 31.5–36.0)
RDW: 17.7 % — ABNORMAL HIGH (ref 11.2–14.5)
WBC: 13.2 10*3/uL — ABNORMAL HIGH (ref 3.9–10.3)
lymph#: 1.5 10*3/uL (ref 0.9–3.3)

## 2009-08-25 ENCOUNTER — Encounter: Payer: Self-pay | Admitting: Internal Medicine

## 2009-08-25 LAB — COMPREHENSIVE METABOLIC PANEL
ALT: 24 U/L (ref 0–35)
Alkaline Phosphatase: 63 U/L (ref 39–117)
CO2: 27 mEq/L (ref 19–32)
Creatinine, Ser: 0.62 mg/dL (ref 0.40–1.20)
Glucose, Bld: 109 mg/dL — ABNORMAL HIGH (ref 70–99)
Sodium: 138 mEq/L (ref 135–145)
Total Bilirubin: 0.6 mg/dL (ref 0.3–1.2)
Total Protein: 6.7 g/dL (ref 6.0–8.3)

## 2009-08-25 LAB — CBC WITH DIFFERENTIAL/PLATELET
HCT: 33.6 % — ABNORMAL LOW (ref 34.8–46.6)
HGB: 11.1 g/dL — ABNORMAL LOW (ref 11.6–15.9)
MCH: 29.5 pg (ref 25.1–34.0)
MCHC: 33 g/dL (ref 31.5–36.0)
MCV: 89.4 fL (ref 79.5–101.0)
Platelets: 208 10*3/uL (ref 145–400)

## 2009-08-25 LAB — MANUAL DIFFERENTIAL
Band Neutrophils: 6 % (ref 0–10)
Basophil: 2 % (ref 0–2)
EOS: 7 % (ref 0–7)
LYMPH: 46 % (ref 14–49)
Other Cell: 0 % (ref 0–0)
PLT EST: ADEQUATE
PROMYELO: 2 % — ABNORMAL HIGH (ref 0–0)
SEG: 6 % — ABNORMAL LOW (ref 38–77)
nRBC: 5 % — ABNORMAL HIGH (ref 0–0)

## 2009-09-06 ENCOUNTER — Ambulatory Visit: Payer: Self-pay | Admitting: Oncology

## 2009-09-08 ENCOUNTER — Encounter: Payer: Self-pay | Admitting: Endocrinology

## 2009-09-08 LAB — CBC WITH DIFFERENTIAL/PLATELET
Eosinophils Absolute: 0 10*3/uL (ref 0.0–0.5)
HCT: 30.8 % — ABNORMAL LOW (ref 34.8–46.6)
LYMPH%: 12.6 % — ABNORMAL LOW (ref 14.0–49.7)
MCV: 92.8 fL (ref 79.5–101.0)
MONO#: 1.2 10*3/uL — ABNORMAL HIGH (ref 0.1–0.9)
MONO%: 9.3 % (ref 0.0–14.0)
NEUT#: 9.9 10*3/uL — ABNORMAL HIGH (ref 1.5–6.5)
NEUT%: 78 % — ABNORMAL HIGH (ref 38.4–76.8)
Platelets: 364 10*3/uL (ref 145–400)
WBC: 12.7 10*3/uL — ABNORMAL HIGH (ref 3.9–10.3)

## 2009-09-08 LAB — COMPREHENSIVE METABOLIC PANEL
ALT: 23 U/L (ref 0–35)
Albumin: 4.2 g/dL (ref 3.5–5.2)
BUN: 15 mg/dL (ref 6–23)
CO2: 22 mEq/L (ref 19–32)
Calcium: 9.3 mg/dL (ref 8.4–10.5)
Chloride: 110 mEq/L (ref 96–112)
Creatinine, Ser: 0.63 mg/dL (ref 0.40–1.20)
Potassium: 4.2 mEq/L (ref 3.5–5.3)

## 2009-09-12 ENCOUNTER — Ambulatory Visit: Admission: RE | Admit: 2009-09-12 | Discharge: 2009-12-09 | Payer: Self-pay | Admitting: Radiation Oncology

## 2009-09-15 ENCOUNTER — Encounter: Payer: Self-pay | Admitting: Endocrinology

## 2009-09-15 LAB — COMPREHENSIVE METABOLIC PANEL
Albumin: 3.5 g/dL (ref 3.5–5.2)
Alkaline Phosphatase: 53 U/L (ref 39–117)
BUN: 14 mg/dL (ref 6–23)
CO2: 25 mEq/L (ref 19–32)
Calcium: 8.7 mg/dL (ref 8.4–10.5)
Chloride: 106 mEq/L (ref 96–112)
Glucose, Bld: 139 mg/dL — ABNORMAL HIGH (ref 70–99)
Potassium: 4 mEq/L (ref 3.5–5.3)
Sodium: 140 mEq/L (ref 135–145)
Total Protein: 5.8 g/dL — ABNORMAL LOW (ref 6.0–8.3)

## 2009-09-15 LAB — CBC WITH DIFFERENTIAL/PLATELET
HCT: 32.2 % — ABNORMAL LOW (ref 34.8–46.6)
Platelets: 208 10*3/uL (ref 145–400)
RBC: 3.5 10*6/uL — ABNORMAL LOW (ref 3.70–5.45)
WBC: 2.1 10*3/uL — ABNORMAL LOW (ref 3.9–10.3)

## 2009-09-15 LAB — MANUAL DIFFERENTIAL
ALC: 1.5 10*3/uL (ref 0.9–3.3)
ANC (CHCC manual diff): 0.1 10*3/uL — CL (ref 1.5–6.5)
Blasts: 2 % — ABNORMAL HIGH (ref 0–0)
MONO: 13 % (ref 0–14)
Metamyelocytes: 2 % — ABNORMAL HIGH (ref 0–0)
Myelocytes: 2 % — ABNORMAL HIGH (ref 0–0)
Other Cell: 0 % (ref 0–0)
SEG: 2 % — ABNORMAL LOW (ref 38–77)
Variant Lymph: 0 % (ref 0–0)

## 2009-09-20 LAB — COMPREHENSIVE METABOLIC PANEL
AST: 26 U/L (ref 0–37)
Albumin: 3.3 g/dL — ABNORMAL LOW (ref 3.5–5.2)
Alkaline Phosphatase: 49 U/L (ref 39–117)
BUN: 12 mg/dL (ref 6–23)
Calcium: 8.8 mg/dL (ref 8.4–10.5)
Chloride: 109 mEq/L (ref 96–112)
Glucose, Bld: 170 mg/dL — ABNORMAL HIGH (ref 70–99)
Potassium: 4.1 mEq/L (ref 3.5–5.3)
Sodium: 144 mEq/L (ref 135–145)
Total Protein: 6 g/dL (ref 6.0–8.3)

## 2009-09-20 LAB — CBC WITH DIFFERENTIAL/PLATELET
Basophils Absolute: 0 10*3/uL (ref 0.0–0.1)
Eosinophils Absolute: 0 10*3/uL (ref 0.0–0.5)
HGB: 10.4 g/dL — ABNORMAL LOW (ref 11.6–15.9)
MCV: 92.8 fL (ref 79.5–101.0)
MONO%: 6.5 % (ref 0.0–14.0)
NEUT#: 5 10*3/uL (ref 1.5–6.5)
RBC: 3.49 10*6/uL — ABNORMAL LOW (ref 3.70–5.45)
RDW: 19.8 % — ABNORMAL HIGH (ref 11.2–14.5)
WBC: 7.3 10*3/uL (ref 3.9–10.3)
lymph#: 1.7 10*3/uL (ref 0.9–3.3)

## 2009-09-28 ENCOUNTER — Ambulatory Visit: Payer: Self-pay

## 2009-09-28 ENCOUNTER — Ambulatory Visit: Payer: Self-pay | Admitting: Endocrinology

## 2009-09-28 ENCOUNTER — Telehealth: Payer: Self-pay | Admitting: Endocrinology

## 2009-09-28 DIAGNOSIS — R609 Edema, unspecified: Secondary | ICD-10-CM | POA: Insufficient documentation

## 2009-10-02 ENCOUNTER — Telehealth: Payer: Self-pay | Admitting: Endocrinology

## 2009-10-02 ENCOUNTER — Telehealth (INDEPENDENT_AMBULATORY_CARE_PROVIDER_SITE_OTHER): Payer: Self-pay | Admitting: *Deleted

## 2009-10-16 ENCOUNTER — Ambulatory Visit (HOSPITAL_COMMUNITY): Admission: RE | Admit: 2009-10-16 | Discharge: 2009-10-16 | Payer: Self-pay | Admitting: Endocrinology

## 2009-10-16 ENCOUNTER — Encounter: Payer: Self-pay | Admitting: Endocrinology

## 2009-10-16 ENCOUNTER — Ambulatory Visit: Payer: Self-pay | Admitting: Internal Medicine

## 2009-10-16 ENCOUNTER — Ambulatory Visit: Payer: Self-pay

## 2009-10-16 HISTORY — PX: TRANSTHORACIC ECHOCARDIOGRAM: SHX275

## 2009-10-17 ENCOUNTER — Ambulatory Visit: Payer: Self-pay | Admitting: Cardiology

## 2009-10-20 ENCOUNTER — Telehealth: Payer: Self-pay | Admitting: Cardiology

## 2009-10-23 ENCOUNTER — Telehealth: Payer: Self-pay | Admitting: Endocrinology

## 2009-11-06 ENCOUNTER — Telehealth: Payer: Self-pay | Admitting: Cardiology

## 2009-11-07 ENCOUNTER — Encounter: Admission: RE | Admit: 2009-11-07 | Discharge: 2009-11-07 | Payer: Self-pay | Admitting: Oncology

## 2009-11-08 ENCOUNTER — Ambulatory Visit: Payer: Self-pay | Admitting: Endocrinology

## 2009-11-08 ENCOUNTER — Ambulatory Visit: Payer: Self-pay | Admitting: Oncology

## 2009-11-08 ENCOUNTER — Ambulatory Visit: Payer: Self-pay | Admitting: Cardiology

## 2009-11-09 LAB — CONVERTED CEMR LAB
BUN: 13 mg/dL (ref 6–23)
Bilirubin Urine: NEGATIVE
CO2: 31 meq/L (ref 19–32)
Chloride: 110 meq/L (ref 96–112)
Creatinine, Ser: 0.6 mg/dL (ref 0.4–1.2)
Glucose, Bld: 94 mg/dL (ref 70–99)
Nitrite: NEGATIVE
Potassium: 4.7 meq/L (ref 3.5–5.1)
Total Protein, Urine: NEGATIVE mg/dL
Urobilinogen, UA: 0.2 (ref 0.0–1.0)
pH: 6.5 (ref 5.0–8.0)

## 2009-11-10 LAB — CBC WITH DIFFERENTIAL/PLATELET
Basophils Absolute: 0 10*3/uL (ref 0.0–0.1)
EOS%: 2.7 % (ref 0.0–7.0)
Eosinophils Absolute: 0.2 10*3/uL (ref 0.0–0.5)
HCT: 33.7 % — ABNORMAL LOW (ref 34.8–46.6)
HGB: 11.3 g/dL — ABNORMAL LOW (ref 11.6–15.9)
MCH: 30.6 pg (ref 25.1–34.0)
MCV: 91.1 fL (ref 79.5–101.0)
MONO%: 12.6 % (ref 0.0–14.0)
NEUT#: 3.3 10*3/uL (ref 1.5–6.5)
NEUT%: 54.3 % (ref 38.4–76.8)
Platelets: 281 10*3/uL (ref 145–400)

## 2009-11-10 LAB — COMPREHENSIVE METABOLIC PANEL
AST: 18 U/L (ref 0–37)
Albumin: 4 g/dL (ref 3.5–5.2)
Alkaline Phosphatase: 66 U/L (ref 39–117)
BUN: 14 mg/dL (ref 6–23)
Calcium: 9.3 mg/dL (ref 8.4–10.5)
Chloride: 106 mEq/L (ref 96–112)
Creatinine, Ser: 0.63 mg/dL (ref 0.40–1.20)
Glucose, Bld: 88 mg/dL (ref 70–99)

## 2009-11-11 LAB — CONVERTED CEMR LAB: Albumin: 4 g/dL (ref 3.5–5.2)

## 2009-11-15 ENCOUNTER — Encounter: Payer: Self-pay | Admitting: Endocrinology

## 2010-01-05 ENCOUNTER — Encounter: Payer: Self-pay | Admitting: Internal Medicine

## 2010-01-12 ENCOUNTER — Ambulatory Visit: Payer: Self-pay | Admitting: Oncology

## 2010-01-16 ENCOUNTER — Encounter: Payer: Self-pay | Admitting: Endocrinology

## 2010-01-17 LAB — COMPREHENSIVE METABOLIC PANEL
Alkaline Phosphatase: 73 U/L (ref 39–117)
CO2: 24 mEq/L (ref 19–32)
Creatinine, Ser: 0.62 mg/dL (ref 0.40–1.20)
Glucose, Bld: 86 mg/dL (ref 70–99)
Sodium: 142 mEq/L (ref 135–145)
Total Bilirubin: 0.3 mg/dL (ref 0.3–1.2)
Total Protein: 7 g/dL (ref 6.0–8.3)

## 2010-01-17 LAB — VITAMIN D 25 HYDROXY (VIT D DEFICIENCY, FRACTURES): Vit D, 25-Hydroxy: 33 ng/mL (ref 30–89)

## 2010-02-11 ENCOUNTER — Encounter: Payer: Self-pay | Admitting: Obstetrics & Gynecology

## 2010-02-11 ENCOUNTER — Encounter: Payer: Self-pay | Admitting: Internal Medicine

## 2010-02-20 NOTE — Letter (Signed)
Summary: [facility]  [facility]   Imported By: Lester Fairland 09/29/2009 11:04:53  _____________________________________________________________________  External Attachment:    Type:   Image     Comment:   External Document

## 2010-02-20 NOTE — Progress Notes (Signed)
Summary: BP & Chemo?  Phone Note Call from Patient Call back at [facility] Phone [phone]   Caller: Patient Summary of Call: pt called stating that her BP decreases with Chemo therapy. Pt is requesting an adjustment of BP meds or dosage, please advise Initial call taken by: Margaret Pyle, CMA,  July 31, 2009 9:48 AM  Follow-up for Phone Call        decrease hyzaar to 1/2 tab once daily ov next week Follow-up by: Minus Breeding MD,  July 31, 2009 12:09 PM  Additional Follow-up for Phone Call Additional follow up Details #1::        Pt advised and will call back to sch appt with SAE next week Additional Follow-up by: Margaret Pyle, CMA,  July 31, 2009 1:34 PM    New/Updated Medications: HYZAAR 50-12.5 MG TABS (LOSARTAN POTASSIUM-HCTZ) 1/2 tab once daily Prescriptions: HYZAAR 50-12.5 MG TABS (LOSARTAN POTASSIUM-HCTZ) 1/2 tab once daily  #15 x 11   Entered and Authorized by:   Minus Breeding MD   Signed by:   Minus Breeding MD on 07/31/2009   Method used:   Electronically to        [facility]  [phone]* (retail)       [address]       [place], [place]  [address]       Ph: [phone] or [phone]       Fax: [phone]   RxID:   [id number]

## 2010-02-20 NOTE — Miscellaneous (Signed)
Summary: Appointment Canceled  Appointment status changed to canceled by [employer] on 08/09/2009 9:14 AM.  Cancellation Comments --------------------- appt @ 4:30/echo/pre chemo  Appointment Information ----------------------- Appt Type:  CARDIOLOGY ANCILLARY VISIT      Date:  Wednesday, August 09, 2009      Time:  5:30 PM for 60 min   Urgency:  Routine   Made By:  Pearson Grippe  To Visit:  [id number]    Reason:  appt @ 4:30/echo/pre chemo  Appt Comments ------------- -- 08/09/09 9:14: ([facility]) CANCELED -- appt @ 4:30/echo/pre chemo -- 08/02/09 12:00: ([facility]) BOOKED -- Routine CARDIOLOGY ANCILLARY VISIT at 08/09/2009 5:30 PM for 60 min appt @ 4:30/echo/pre chemo

## 2010-02-20 NOTE — Letter (Signed)
[facility]   Imported By: Sherian Rein 08/14/2009 14:40:53  _____________________________________________________________________  External Attachment:    Type:   Image     Comment:   External Document

## 2010-02-20 NOTE — Letter (Signed)
[facility]   Imported By: Sherian Rein 08/08/2009 09:34:55  _____________________________________________________________________  External Attachment:    Type:   Image     Comment:   External Document

## 2010-02-20 NOTE — Progress Notes (Signed)
Summary: PT ? ON BLOOD WORK   Phone Note Call from Patient   Caller: Patient Reason for Call: Talk to Nurse Summary of Call: PT WANTS KNOW IF SHE NEEDS ANY BLOOD WORK DONE BEFORE SHE SEE DR. Riley Kill. Initial call taken by: Roe Coombs,  November 06, 2009 9:40 AM  Follow-up for Phone Call        Left message to call back. Julieta Gutting, RN, BSN  November 06, 2009 10:27 AM  Liver function checked today by health department. The pt does still have some swelling but only when she stands on her feet for a long period of time.  The pt said Dr Riley Kill had mentioned checking a protein level and one other test at her last appt. She would like to get this done prior to her appt if needed.  Follow-up by: Julieta Gutting, RN, BSN,  November 06, 2009 3:05 PM  Additional Follow-up for Phone Call Additional follow up Details #1::        Per Dr Riley Kill he recommends a serum albumin, UA, and BMP. Julieta Gutting, RN, BSN  November 06, 2009 6:44 PM  Left message for pt to call back. Julieta Gutting, RN, BSN  November 07, 2009 10:29 AM  I spoke with the pt and she will have labs today at the [place] office.  Additional Follow-up by: Julieta Gutting, RN, BSN,  November 08, 2009 9:48 AM

## 2010-02-20 NOTE — Progress Notes (Signed)
Summary: BP meds  Phone Note Call from Patient Call back at Home Phone [phone]   Caller: Patient Summary of Call: Pt called stating that she has been monitoring her BP and it tends to be elevated more often. Pt wants to re-start previous BP med or at a lower dose but wants MD's advisement. Initial call taken by: Margaret Pyle, CMA,  October 20, 2009 10:35 AM  Follow-up for Phone Call        dr Riley Kill is following the swelling, a closely related problem, and last saw you just 2 days ago.  could you please re-direct the question to him? Follow-up by: Minus Breeding MD,  October 20, 2009 11:03 AM  Additional Follow-up for Phone Call Additional follow up Details #1::        One could consider Hyzaar 50/12.5 one half daily.  She took it before.  Edema is trace.  Her LV is normal with mild diastolic abnormality.  This is a general type issue.  She will need a BMET before starting, and one week after starting.  She previously was on K supplement as well.  Thirty minutes reviewing chart.  Additional Follow-up by: Ronaldo Miyamoto, MD, [facility],  October 23, 2009 10:51 AM     Appended Document: BP meds I called the pt and she has already spoken with Dr Everardo All today and has decided to hold off on starting BP meds.  See phone note in chart from Dr Everardo All, done today.

## 2010-02-20 NOTE — Assessment & Plan Note (Signed)
Summary: flu vac sae  stc   Nurse Visit   Allergies: 1)  ! Pcn  Orders Added: 1)  Flu Vaccine 51yrs + MEDICARE PATIENTS [Q2039] 2)  Administration Flu vaccine - MCR [G0008] .lbmedflu   Flu Vaccine Consent Questions     Do you have a history of severe allergic reactions to this vaccine? no    Any prior history of allergic reactions to egg and/or gelatin? no    Do you have a sensitivity to the preservative Thimersol? no    Do you have a past history of Guillan-Barre Syndrome? no    Do you currently have an acute febrile illness? no    Have you ever had a severe reaction to latex? no    Vaccine information given and explained to patient? yes    Are you currently pregnant? no    Lot Number:[id number]   Exp Date:07/21/2010   Site Given  Left Deltoid IM Lanier Prude, [facility])  November 08, 2009 11:42 AM

## 2010-02-20 NOTE — Assessment & Plan Note (Signed)
Summary: cough-fever-body aches chills--stc   Vital Signs:  Patient profile:   65 year old female Height:      62 inches (157.48 cm) Weight:      177.38 pounds (80.63 kg) O2 Sat:      94 % on Room air Temp:     100.9 degrees F (38.28 degrees C) oral Pulse rate:   98 / minute BP sitting:   116 / 66  (left arm) Cuff size:   regular  Vitals Entered By: Josph Macho RMA (March 13, 2009 9:24 AM)  O2 Flow:  Room air CC: Cough, fever, body aches, chills x3days/ pt stated she started Mucinex DM over the weekend/ pt states she is not taking Tamiflu/ CF   Referring Provider:  [place] Callas Primary Provider:  Everardo All  CC:  Cough, fever, body aches, and chills x3days/ pt stated she started Mucinex DM over the weekend/ pt states she is not taking Tamiflu/ CF.  History of Present Illness: pt states 3 days of moderate myalgias of the arms and legs, chills, and associated cough. she wants a cheaper alternative to micardis-hct.   Current Medications (verified): 1)  Albuterol 90 Mcg/act Aers (Albuterol) .... Inhale 2 Puff As Directed Every Six Hours 2)  Alprazolam 0.25 Mg Tabs (Alprazolam) .... Take 1 Tablet By Mouth Once A Day 3)  Flonase 50 Mcg/act Susp (Fluticasone Propionate) .... Inhale 1 Puff As Directed Once A Day 4)  Tamiflu 75 Mg Caps (Oseltamivir Phosphate) .... Take 1 Capsule By Mouth As Directed 5)  Levoxyl 25 Mcg  Tabs (Levothyroxine Sodium) .Marland Kitchen.. 1po Once Daily 6)  Micardis Hct 40-12.5 Mg  Tabs (Telmisartan-Hctz) .Marland Kitchen.. 1 By Mouth Once Daily 7)  Nexium 40 Mg  Cpdr (Esomeprazole Magnesium) .... Take 1 Tablet By Mouth Once A Day 8)  Caltrate 600+d 600-400 Mg-Unit  Tabs (Calcium Carbonate-Vitamin D) .... Take 1 Tablet By Mouth Once A Day 9)  Singulair 10 Mg  Tabs (Montelukast Sodium) .... One By Mouth Daily As Needed 10)  Vitamin D 1000 Unit Tabs (Cholecalciferol) .... Take 1 Tablet By Mouth Once A Day 11)  Centrum Silver  Tabs (Multiple Vitamins-Minerals) .... Take 1 Tablet By Mouth  Once A Day 12)  Motrin Ib 200 Mg Tabs (Ibuprofen) .... As Directed 13)  Pravastatin Sodium 80 Mg Tabs (Pravastatin Sodium) .Marland Kitchen.. 1 Qhs 14)  Klor-Con M10 10 Meq Cr-Tabs (Potassium Chloride Crys Cr) .Marland Kitchen.. 1 Qd  Allergies (verified): 1)  ! Pcn  Past History:  Past Medical History: Last updated: 11/12/2007 HIATAL HERNIA (ICD-553.3) COLONIC POLYPS (ICD-211.3) DIVERTICULOSIS, COLON (ICD-562.10) HYPERLIPIDEMIA (ICD-272.4) HYPERTENSION (ICD-401.9) HYPOTHYROIDISM (ICD-244.9) ASTHMA (ICD-493.90) Anxiety DJD - left knee glucose intolerance GERD Anemia-iron deficiency Hypothyroidism Allergic rhinitis multinodular goiter Depression Osteopenia Urinary Tract Infection  Review of Systems       she reports slight wheezing and low-grade fever  Physical Exam  General:  normal appearance.   Head:  head: no deformity eyes: no periorbital swelling, no proptosis external nose and ears are normal mouth: no lesion seen Ears:  both tm's are slightly red Lungs:  few exp wheezes there are a few rales at the left base Additional Exam:  AST                  [H]  39 U/L                      0-37 ALT                  [  H]  39 U/L    Creatine Kinase      [H]  959 U/L      Impression & Recommendations:  Problem # 1:  COUGH (ICD-786.2) due to a flu-like illness  Problem # 2:  myositis mild, prob due to #1  Problem # 3:  HYPERTENSION (ICD-401.9) well-controlled  Problem # 4:  elevated hepatic transaminases prob due to #1  Medications Added to Medication List This Visit: 1)  Hyzaar 50-12.5 Mg Tabs (Losartan potassium-hctz) .Marland Kitchen.. 1 qd 2)  Promethazine-codeine 6.25-10 Mg/27ml Syrp (Promethazine-codeine) .Marland Kitchen.. 1 tsp every 4 hrs as needed cough 3)  Azithromycin 500 Mg Tabs (Azithromycin) .Marland Kitchen.. 1 qd  Other Orders: T-2 View CXR (71020TC) TLB-BMP (Basic Metabolic Panel-BMET) (80048-METABOL) TLB-Hepatic/Liver Function Pnl (80076-HEPATIC) TLB-Lipid Panel (80061-LIPID) TLB-CK Total Only(Creatine  Kinase/CPK) (82550-CK) Prescription Created Electronically (Z6109) Est. Patient Level IV (60454)  Patient Instructions: 1)  change micardis-hct to losartan-hct, 50/12.5, 1/day. 2)  tests are being ordered for you today.  a few days after the test(s), please call [phone] to hear your test results. 3)  phenergan-codeine 1 teaspoon every 4 hrs as needed for cough 4)  azithromycin 500 mg once daily 5)  chest x ray and blood tests today. 6)  loratadine-d (non-prescription) as needed for congestion 7)  (update: i left message on phone-tree:  come to lab next week for recheck of cpk 729.1 and liver panel 573.9) Prescriptions: AZITHROMYCIN 500 MG TABS (AZITHROMYCIN) 1 qd  #6 x 0   Entered and Authorized by:   Minus Breeding MD   Signed by:   Minus Breeding MD on 03/13/2009   Method used:   Electronically to        [facility]  [phone]* (retail)       [address]       [place], [place]  [address]       Ph: [phone] or [phone]       Fax: [phone]   RxID:   [id number] PROMETHAZINE-CODEINE 6.25-10 MG/5ML SYRP (PROMETHAZINE-CODEINE) 1 tsp every 4 hrs as needed cough  #8 oz x 1   Entered and Authorized by:   Minus Breeding MD   Signed by:   Minus Breeding MD on 03/13/2009   Method used:   Print then Give to Patient   RxID:   [id number] HYZAAR 50-12.5 MG TABS (LOSARTAN POTASSIUM-HCTZ) 1 qd  #30 x 11   Entered and Authorized by:   Minus Breeding MD   Signed by:   Minus Breeding MD on 03/13/2009   Method used:   Electronically to        [facility]  [phone]* (retail)       [address]       [place], [place]  [address]       Ph: [phone] or [phone]       Fax: [phone]   RxID:   [id number]   Appended Document: Orders Update     Clinical Lists Changes  Orders: Added new Service order of Prescription Created Electronically [phone]) - Signed

## 2010-02-20 NOTE — Letter (Signed)
Summary: [facility]  [facility]   Imported By: Sherian Rein 03/09/2009 11:50:49  _____________________________________________________________________  External Attachment:    Type:   Image     Comment:   External Document

## 2010-02-20 NOTE — Progress Notes (Signed)
Summary: update   Phone Note Call from Patient Call back at Home Phone [phone]   Caller: Patient Call For: Dr. Juanda Chance Reason for Call: Talk to Nurse Summary of Call: pt wants Dr. Juanda Chance to be aware that she is going through chemo and would like speak to Dr. Juanda Chance about this  Initial call taken by: Vallarie Mare,  July 13, 2009 1:30 PM  Follow-up for Phone Call        Pt wants Dr. Juanda Chance to be aware that she has breast cancer.  had surgery one month ago and is now taking chemotherapy.  takes a treatment every 3 weeks.  wanted to make sure that this will not interfer with her nexium. (Pt aware Dr. Juanda Chance is on vacation) Follow-up by: Ashok Cordia RN,  July 13, 2009 2:00 PM  Additional Follow-up for Phone Call Additional follow up Details #1::        Please continue to take Nexium for GERD Additional Follow-up by: Hart Carwin MD,  July 13, 2009 10:43 PM    Additional Follow-up for Phone Call Additional follow up Details #2::    Pt notified.   Follow-up by: Ashok Cordia RN,  July 17, 2009 9:27 AM

## 2010-02-20 NOTE — Letter (Signed)
[facility]   Imported By: Sherian Rein 09/08/2009 14:14:04  _____________________________________________________________________  External Attachment:    Type:   Image     Comment:   External Document

## 2010-02-20 NOTE — Progress Notes (Signed)
  Phone Note Call from Patient Call back at Home Phone [phone]   Caller: Patient Call For: p Summary of Call: Pt requests a call from Dr George Hugh office as she states Dr Caron Presume told her to call Dr Everardo All regarding bp meds. Pt states she has spoke w/Dr Riley Kill.  Initial call taken by: Verdell Face,  October 23, 2009 12:05 PM  Follow-up for Phone Call        i called pt 10/23/09.  we discussed options.  pt will continue lasix q week, and will hold-off on cozaar for now--i agree. Follow-up by: Minus Breeding MD,  October 23, 2009 2:02 PM    New/Updated Medications: ISONIAZID 300 MG TABS (ISONIAZID) 1 tab once daily

## 2010-02-20 NOTE — Letter (Signed)
Summary: [facility]   Imported By: Lester Bonesteel 06/21/2009 08:38:31  _____________________________________________________________________  External Attachment:    Type:   Image     Comment:   External Document

## 2010-02-20 NOTE — Letter (Signed)
Summary: [facility]  [facility]   Imported By: Sherian Rein 06/28/2009 12:01:54  _____________________________________________________________________  External Attachment:    Type:   Image     Comment:   External Document

## 2010-02-20 NOTE — Letter (Signed)
[facility]   Imported By: Sherian Rein 09/13/2009 14:48:41  _____________________________________________________________________  External Attachment:    Type:   Image     Comment:   External Document

## 2010-02-20 NOTE — Progress Notes (Signed)
Summary: CANCER  Phone Note Call from Patient Call back at Home Phone [phone]   Caller: Patient Summary of Call: PT CALLED TO LET DR Everardo All KNOW THAT SHE HAS BREAST CANCER AND IS GOING THROUGH CHEMO.  SHE WANT ALL OF HER DOCTORS TO BE AWARE. Initial call taken by: Hilarie Fredrickson,  July 13, 2009 1:17 PM  Follow-up for Phone Call        i already heard.  we at [facility] wish you the best.  come in if we can do anything for you. Follow-up by: Minus Breeding MD,  July 13, 2009 2:00 PM  Additional Follow-up for Phone Call Additional follow up Details #1::        left message on machine for pt to return my call. Margaret Pyle, CMA  July 13, 2009 4:23 PM  left message on machine for pt to return my call, Margaret Pyle, CMA  July 14, 2009 2:43 PM     Additional Follow-up for Phone Call Additional follow up Details #2::    I have called pt and left several messages with no return call. Closing phone note until further contact from pt. Follow-up by: Margaret Pyle, CMA,  July 17, 2009 2:33 PM   Appended Document: CANCER pt called back and was advised of above. Per pt she was very sick afterward. Pt was grateful for the call.  DB

## 2010-02-20 NOTE — Progress Notes (Signed)
Summary: BP medicine?  Phone Note Call from Patient Call back at [facility] Phone [phone]   Caller: Patient Summary of Call: Pt called stating that she is concerned about BP medication (pt has re-started medicine). Pt states that she has recently began to notice swelling of her hands and feet but now it is her entire body. Please advise, could this reaction be caused by BP medicine? Initial call taken by: Margaret Pyle, CMA,  September 28, 2009 9:25 AM  Follow-up for Phone Call        please advise ov.  we need to check the swelling. Follow-up by: Minus Breeding MD,  September 28, 2009 9:41 AM  Additional Follow-up for Phone Call Additional follow up Details #1::        Pt stating that she does not have the strength to come into OV, she had her last chemo treatment 5 days ago. Pt is requesting medication change. Additional Follow-up by: Margaret Pyle, CMA,  September 28, 2009 10:07 AM    Additional Follow-up for Phone Call Additional follow up Details #2::    sorry, i can't give advice about bp med without seeing you.  things can change due to your chemo.  i hope you are feeling better. Follow-up by: Minus Breeding MD,  September 28, 2009 10:17 AM  Additional Follow-up for Phone Call Additional follow up Details #3:: Details for Additional Follow-up Action Taken: Pt advised and transferred to sch appt. Appt double book okay's by SAE Additional Follow-up by: Margaret Pyle, CMA,  September 28, 2009 10:23 AM

## 2010-02-20 NOTE — Letter (Signed)
Summary: NP Eval/[facility]  NP Eval/[facility]   Imported By: Sherian Rein 05/26/2009 14:14:14  _____________________________________________________________________  External Attachment:    Type:   Image     Comment:   External Document

## 2010-02-20 NOTE — Assessment & Plan Note (Signed)
Summary: SWELLING/NWS PER TRIAGE/NWS   Vital Signs:  Patient profile:   65 year old female Height:      62 inches (157.48 cm) Weight:      190 pounds (86.36 kg) BMI:     34.88 O2 Sat:      97 % on Room air Temp:     98.7 degrees F (37.06 degrees C) oral Pulse rate:   91 / minute BP sitting:   112 / 64  (left arm) Cuff size:   large  Vitals Entered By: Brenton Grills MA (September 28, 2009 11:20 AM)  O2 Flow:  Room air CC: swelling, tired/aj   Referring Provider:  [place] Callas Primary Provider:  Everardo All  CC:  swelling and tired/aj.  History of Present Illness: pt states few weeks of slight swelling of both legs.  no assoc sob.  she took hyzaar, but this lowered her bp.  she says swelling is worse in the evening.   she is currently in a break between chemotx and radiation.   pt says she was rx'ed k+, but more recent k+ level was normal, and she was tol she could go of the med.  Current Medications (verified): 1)  Albuterol 90 Mcg/act Aers (Albuterol) .... Inhale 2 Puff As Directed Every Six Hours 2)  Levoxyl 25 Mcg  Tabs (Levothyroxine Sodium) .Marland Kitchen.. 1po Once Daily 3)  Nexium 40 Mg  Cpdr (Esomeprazole Magnesium) .... Take 1 Tablet By Mouth Once A Day 4)  Caltrate 600+d 600-400 Mg-Unit  Tabs (Calcium Carbonate-Vitamin D) .... Take 1 Tablet By Mouth Once A Day 5)  Singulair 10 Mg  Tabs (Montelukast Sodium) .... One By Mouth Daily As Needed 6)  Vitamin D 1000 Unit Tabs (Cholecalciferol) .... Take 1 Tablet By Mouth Once A Day 7)  Centrum Silver  Tabs (Multiple Vitamins-Minerals) .... Take 1 Tablet By Mouth Once A Day 8)  Motrin Ib 200 Mg Tabs (Ibuprofen) .... As Directed 9)  Pravastatin Sodium 80 Mg Tabs (Pravastatin Sodium) .Marland Kitchen.. 1 Qhs 10)  Klor-Con M10 10 Meq Cr-Tabs (Potassium Chloride Crys Cr) .Marland Kitchen.. 1 Qd 11)  Hyzaar 50-12.5 Mg Tabs (Losartan Potassium-Hctz) .... 1/2 Tablet Once Daily  Allergies (verified): 1)  ! 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  The patient denies chest pain.    Physical Exam  General:  obese.  no distress  Extremities:  1+ right pedal edema and 1+ left pedal edema.     Impression & Recommendations:  Problem # 1:  HYPERTENSION (ICD-401.9) overcontrolled  Problem # 2:  EDEMA (ICD-782.3) uncertain etiology  Problem # 3:  HYPOKALEMIA (ICD-276.8) Assessment: Improved  Medications Added to Medication List This Visit: 1)  Hyzaar 50-12.5 Mg Tabs (Losartan potassium-hctz) .... 1/2 tablet once daily 2)  Compression Stockings To Knees  .Marland Kitchen.. 782.3  Other Orders: [facility] ([facility]) Est. Patient Level IV ([address])  Patient Instructions: 1)  check  for blood clots in the legs today.   2)  stop losartan-hctz. 3)  ok to stay-off the potassium for now.   4)  wear compression stockings.   5)  Please schedule a follow-up appointment in 1 month. Prescriptions: COMPRESSION STOCKINGS TO KNEES 782.3  #1 pair x 1   Entered and Authorized by:   Minus Breeding MD   Signed  by:   Minus Breeding MD on 09/28/2009   Method used:   Print then Give to Patient   RxID:   [id number]

## 2010-02-20 NOTE — Miscellaneous (Signed)
Summary: Order/[facility]  Order/[facility]   Imported By: Sherian Rein 07/13/2009 10:27:09  _____________________________________________________________________  External Attachment:    Type:   Image     Comment:   External Document

## 2010-02-20 NOTE — Letter (Signed)
[facility]   Imported By: Lester [place] 07/28/2009 09:25:20  _____________________________________________________________________  External Attachment:    Type:   Image     Comment:   External Document

## 2010-02-20 NOTE — Assessment & Plan Note (Signed)
Summary: Swelling  Medications Added * VITAMIN B-6 TABS (PYRIDOXINE HCL) once daily * TB MEDICATION pt unsure of name      Allergies Added:   Visit Type:  Follow-up Referring Rashad Auld:  Benson Callas Primary Jeraldin Fesler:  Everardo All  CC:  gaining weight and edema .  History of Present Illness: She was getting some mild swelling after every chemotherapy.  It has been the worst after the last treatment, and has stayed the same ever since.  Dopplers in the leg were done, and she was told they were ok.  She had an echo yesterday.  She does not watch her salt closely as well.   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  Denies chest pain.  Current Medications (verified): 1)  Albuterol 90 Mcg/act Aers (Albuterol) .... Inhale 2 Puff As Directed Every Six Hours 2)  Levoxyl 25 Mcg  Tabs (Levothyroxine Sodium) .Marland Kitchen.. 1po Once Daily 3)  Nexium 40 Mg  Cpdr (Esomeprazole Magnesium) .... Take 1 Tablet By Mouth Once A Day 4)  Caltrate 600+d 600-400 Mg-Unit  Tabs (Calcium Carbonate-Vitamin D) .... Take 1 Tablet By Mouth Once A Day 5)  Singulair 10 Mg  Tabs (Montelukast Sodium) .... One By Mouth Daily As Needed 6)  Vitamin D 1000 Unit Tabs (Cholecalciferol) .... Take 1 Tablet By Mouth Once A Day 7)  Centrum Silver  Tabs (Multiple Vitamins-Minerals) .... Take 1 Tablet By Mouth Once A Day 8)  Motrin Ib 200 Mg Tabs (Ibuprofen) .... As Directed 9)  Pravastatin Sodium 80 Mg Tabs (Pravastatin Sodium) .Marland Kitchen.. 1 Qhs 10)  Compression Stockings To Knees .Marland Kitchen.. 782.3 11)  Vitamin B-6 Tabs (Pyridoxine Hcl) .... Once Daily 12)  Tb Medication .... Pt Unsure of Name  Allergies (verified): 1)  ! 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) OBSTRUCTIVE SLEEP APNEA (ICD-327.23) FATIGUE (ICD-780.79) OSTEOPENIA (ICD-733.90) GOITER, MULTINODULAR (ICD-241.1) ALLERGIC RHINITIS (ICD-477.9) ANEMIA-IRON DEFICIENCY (ICD-280.9) GLUCOSE INTOLERANCE (ICD-271.3) ANXIETY (ICD-300.00) HYPERSOMNIA (ICD-780.54) MYOFASCIAL PAIN SYNDROME (ICD-729.1) DEPRESSION (ICD-311) CARPAL TUNNEL SYNDROME, BILATERAL (ICD-354.0) IRON DEFICIENCY (ICD-280.9) GERD (ICD-530.81) HIATAL HERNIA (ICD-553.3) COLONIC POLYPS (ICD-211.3) DIVERTICULOSIS, COLON (ICD-562.10) HYPERLIPIDEMIA (ICD-272.4) HYPERTENSION (ICD-401.9) HYPOTHYROIDISM (ICD-244.9) ASTHMA (ICD-493.90)    Past Surgical History: Last updated: 07/04/2009 hysterectomy Carpal tunnel release s/p urethral dilation s/p breast biopsy - neg x 2 Cataract extraction L breast lumpectomy April 2011  Family History: Last updated: 07/15/2007 father with colon cancer 62 yo brother and sister with HTN aunt with thyroid disease son with ulcerative colitis arthritis prostate cancer   asthma: son, mother cancer: maternal grandfather (stomach)  Social History: Last updated: 06/26/2007 Alcohol use-no 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 Head:  normocephalic and atraumatic Eyes:  PERRLA/EOM intact; conjunctiva and lids normal. Neck:  Neck veins not distended.  Lungs:  Clear bilaterally to auscultation and percussion. Heart:  PMI non displaced.  Normal S1 and S2.  No def murmur.     Abdomen:  Bowel sounds positive; abdomen soft and non-tender without masses, organomegaly, or hernias noted. No hepatosplenomegaly. Pulses:  pulses normal in all 4 extremities Extremities:  No clubbing or cyanosis.  Trace edema.  Minimal pit Neurologic:  Alert and oriented x 3.   EKG  Procedure date:  11/16/2009  Findings:      NSR.  Slight delay in R wave progression, probably normal.  Echocardiogram  Procedure date:  10/16/2009  Findings:      - Left ventricle: The cavity size was normal. Wall thickness was       increased in a pattern of mild LVH. Systolic function was       vigorous. The estimated ejection fraction was in the range of 65%       to 70%. Wall motion was normal; there were no regional wall motion       abnormalities. Doppler parameters are consistent with abnormal       left ventricular relaxation (grade 1 diastolic dysfunction).     - Pulmonary arteries: PA peak pressure: 32mm Hg (S).  CXR  Procedure date:  07/13/2009  Findings:       CHEST - 2 VIEW 07/13/2009:    Comparison: Two-view chest x-ray 03/13/2009 and 04/27/2002.    Findings: Heart size normal and stable.  Thoracic aorta tortuous,   unchanged.  Hilar and mediastinal contours otherwise unremarkable.   Lungs clear.  Bronchovascular markings normal.  No pleural   effusions.  Eventration of the right anterior hemidiaphragm,   unchanged.  Degenerative changes throughout the thoracic spine.    IMPRESSION:   No acute cardiopulmonary disease.  No evidence of metastasis.    Read By:  Arnell Sieving,  M.D.   Released By:  Arnell Sieving,  M.D.   [place] of Abdomen  Procedure date:  06/12/2009  Findings:       Right kidney:  Normal in size (10.7 cm) and parenchymal   echogenicity.  No evidence of mass or hydronephrosis.    Left kidney:  Normal in size (11.8 cm) and parenchymal   echogenicity.  No evidence of mass or hydronephrosis.    Abdominal Aorta:  No aneurysm identified.     IMPRESSION:   No hepatic mass.  Diffuse hepatocellular disease - findings   consistent with fatty infiltration.  Suboptimal visualization of   the pancreas.    Read By:  Jonne Ply,  M.D.   Released By:  Jonne Ply,  M.D.   Impression & Recommendations:  Problem # 1:  EDEMA (ICD-782.3) Edema is at best mild, and her jugular veins are not elevated, RV is not enlarged, and their is trace in the lower extremities.  She is not short of breath.  Suggested she keep her legs elevated, reduce her sodium load, and generally watch for now.  Would prefer to avoid diuretics.  She has a small component of diastolic dysfunction noted despite normal EF, and we reviewed this in detail today.  Problem # 2:  HYPERTENSION (ICD-401.9) Currently not on any BP meds, and I think we will need to watch this at present.  With chemotherapy and  impact of that, she may not need any current treatment.  We will reevaluate in one month, and reassess her present situation.  If more edema, she is to call us.  The following medications  were removed from the medication list:    Furosemide 20 Mg Tabs (Furosemide) .Marland Kitchen... 1 once daily  Orders: EKG w/ Interpretation (93000)  Patient Instructions: 1)  Your physician recommends that you schedule a follow-up appointment in: 1 MONTH 2)  Your physician recommends that you continue on your current medications as directed. Please refer to the Current Medication list given to you today. 3)  Please decrease your salt intake.

## 2010-02-20 NOTE — Miscellaneous (Signed)
Summary: Appointment Canceled  Appointment status changed to canceled by [employer] on 08/02/2009 12:00 PM.  Cancellation Comments --------------------- appt @ 4:30/echo/pre chemo  Appointment Information ----------------------- Appt Type:  CARDIOLOGY ANCILLARY VISIT      Date:  Thursday, August 03, 2009      Time:  4:00 PM for 60 min   Urgency:  Routine   Made By:  Pearson Grippe  To Visit:  [id number]    Reason:  appt @ 4:30/echo/pre chemo  Appt Comments ------------- -- 08/02/09 12:00: ([facility]) CANCELED -- appt @ 4:30/echo/pre chemo -- 08/01/09 15:11: ([facility]) BOOKED -- Routine CARDIOLOGY ANCILLARY VISIT at 08/03/2009 4:00 PM for 60 min appt @ 4:30/echo/pre chemo

## 2010-02-20 NOTE — Miscellaneous (Signed)
Summary: Orders Update  Clinical Lists Changes  Orders: Added new Test order of Venous Duplex Lower Extremity (Venous Duplex Lower) - Signed 

## 2010-02-20 NOTE — Progress Notes (Signed)
Summary: SEND LABS TO DR RUBEN  Phone Note Call from Patient Call back at Home Phone [phone]   Caller: Patient Summary of Call: PT IS REQUESTING THAT THE THYROID LAB REPORT BE SENT TO DR Theron Arista RUBEN AT [facility]. Initial call taken by: Hilarie Fredrickson,  Jun 12, 2009 10:04 AM  Follow-up for Phone Call        Pt results not available as she has blood draw 30 minutes before this call. Follow-up by: Margaret Pyle, CMA,  Jun 12, 2009 10:23 AM  Additional Follow-up for Phone Call Additional follow up Details #1::        faxed TSH lab results to [facility] attn Dr Beatris Ship Additional Follow-up by: Margaret Pyle, CMA,  Jun 12, 2009 1:40 PM

## 2010-02-20 NOTE — Assessment & Plan Note (Signed)
Summary: BP HAS DROPPED/ NOT TAKING BP MEDS/NWS   Vital Signs:  Patient profile:   65 year old female Height:      62 inches (157.48 cm) Weight:      183.25 pounds (83.30 kg) BMI:     33.64 O2 Sat:      96 % on Room air Temp:     98.7 degrees F (38.06 degrees C) oral Pulse rate:   96 / minute BP sitting:   122 / 72  (left arm) Cuff size:   regular  Vitals Entered By: Brenton Grills MA (August 01, 2009 1:08 PM)  O2 Flow:  Room air CC: BP is dropping/hasn't taken BP meds in 2 days/aj   Referring Provider:  Redkey Callas Primary Provider:  Everardo All  CC:  BP is dropping/hasn't taken BP meds in 2 days/aj.  History of Present Illness: pt stopped hyzaar each day she has chemotx.  she is due for 2 more treatments.  she last took hyzaar 3 days ago.   she has fatigue, but no weight change.  Current Medications (verified): 1)  Albuterol 90 Mcg/act Aers (Albuterol) .... Inhale 2 Puff As Directed Every Six Hours 2)  Levoxyl 25 Mcg  Tabs (Levothyroxine Sodium) .Marland Kitchen.. 1po Once Daily 3)  Nexium 40 Mg  Cpdr (Esomeprazole Magnesium) .... Take 1 Tablet By Mouth Once A Day 4)  Caltrate 600+d 600-400 Mg-Unit  Tabs (Calcium Carbonate-Vitamin D) .... Take 1 Tablet By Mouth Once A Day 5)  Singulair 10 Mg  Tabs (Montelukast Sodium) .... One By Mouth Daily As Needed 6)  Vitamin D 1000 Unit Tabs (Cholecalciferol) .... Take 1 Tablet By Mouth Once A Day 7)  Centrum Silver  Tabs (Multiple Vitamins-Minerals) .... Take 1 Tablet By Mouth Once A Day 8)  Motrin Ib 200 Mg Tabs (Ibuprofen) .... As Directed 9)  Pravastatin Sodium 80 Mg Tabs (Pravastatin Sodium) .Marland Kitchen.. 1 Qhs 10)  Klor-Con M10 10 Meq Cr-Tabs (Potassium Chloride Crys Cr) .Marland Kitchen.. 1 Qd 11)  Hyzaar 50-12.5 Mg Tabs (Losartan Potassium-Hctz) .... 1/2 Tab Once Daily  Allergies (verified): 1)  ! Pcn  Review of Systems  The patient denies syncope.    Physical Exam  General:  normal appearance.   Lungs:  Clear to auscultation bilaterally. Normal respiratory  effort.  Heart:  Regular rate and rhythm without murmurs or gallops noted. Normal S1,S2.     Impression & Recommendations:  Problem # 1:  HYPERTENSION (ICD-401.9) overcontrolled  Other Orders: Echo Referral (Echo) Est. Patient Level III (16109)  Patient Instructions: 1)  stop losartan-hctz.  at a future date, we may need to resume at 1/2 pill once daily.   2)  check echocardiogram.  you will be called with a day and time for an appointment 3)  Please schedule a physical appointment in 2 months.

## 2010-02-20 NOTE — Progress Notes (Signed)
Summary: SE?  Phone Note Call from Patient Call back at [facility] Phone [phone]   Summary of Call: Pt's daughter called stating that leg swelling is worse now and left leg is numb. Pt is very weak and also has a low grade temp. Pt contacted Chemo to find out if this is a side effect of treatment but was advised to call PCP. Pt wanted to know if MD thibks she would benefit from Cardi eval and low dose BP med? Pt also wanted MD to be aware that she has started Isoniazid 300mg  once daily and Vit B6 25mg  once daily. If posible pt is also requesting a call from MD of a callto daughter who is a MD in ID at [facility] [facility]. Daughter Suzi Hernan [phone] Initial call taken by: Margaret Pyle, CMA,  October 02, 2009 9:54 AM  Follow-up for Phone Call        go to er now. Follow-up by: Minus Breeding MD,  October 02, 2009 10:11 AM  Additional Follow-up for Phone Call Additional follow up Details #1::        Pt states that she discussed this with SAE at [facility] and told him that she was advised that this is a side effect of Chemo. Pt is requesting Rx for diuretic and possible Cardiac work up. Please advise. Additional Follow-up by: Margaret Pyle, CMA,  October 02, 2009 11:07 AM    Additional Follow-up for Phone Call Additional follow up Details #2::    i have referred to cardiol. if you have fever over 100.5, go to er. Follow-up by: Minus Breeding MD,  October 02, 2009 11:40 AM  Additional Follow-up for Phone Call Additional follow up Details #3:: Details for Additional Follow-up Action Taken: Pt is very upset, stating that is is useless to waitunless 10/17 for eval with Cardiology. Pt states that she is very fatigued, weak and her legs are extremely swollen. Pt is requesting an earlier appt with Dr Tedra Senegal and a call from Dr Everardo All. Margaret Pyle, CMA  October 02, 2009 1:59 PM   New/Updated Medications: FUROSEMIDE 20 MG TABS (FUROSEMIDE) 1 once  daily Prescriptions: FUROSEMIDE 20 MG TABS (FUROSEMIDE) 1 once daily  #30 x 11   Entered and Authorized by:   Minus Breeding MD   Signed by:   Minus Breeding MD on 10/02/2009   Method used:   Electronically to        [facility]  [phone]* (retail)       [address]       [place], [place]  [address]       Ph: [phone] or [phone]       Fax: [phone]   RxID:   [id number]  i called pt 10/02/09.  i sent rx for lasix to pharmacy.  pt agrees. sean ellison, md

## 2010-02-20 NOTE — Letter (Signed)
Summary: [facility]  [facility]   Imported By: Sherian Rein 02/27/2009 10:41:10  _____________________________________________________________________  External Attachment:    Type:   Image     Comment:   External Document

## 2010-02-20 NOTE — Letter (Signed)
[facility]   Imported By: Lester [place] 07/28/2009 09:26:10  _____________________________________________________________________  External Attachment:    Type:   Image     Comment:   External Document

## 2010-02-20 NOTE — Letter (Signed)
[facility]   Imported By: Sherian Rein 07/26/2009 10:14:10  _____________________________________________________________________  External Attachment:    Type:   Image     Comment:   External Document

## 2010-02-20 NOTE — Assessment & Plan Note (Signed)
Summary: rov for osa   Copy to:  [place] Callas Primary Provider/Referring Provider:  Everardo All  CC:  Pt is here for a routine f/u appt.  Pt states she was recently dx with breast cancer- had lumpectomy and is now seeing Dr. Pierce Crane for chemo.  Pt states she is wearing her cpap machine most nights- approx 6 hours per night.  Pt states she needs a new mask- current mask causes sore spot on nose.  Pt does c/o feeling tired and is unsure if this is d/t the cancer or sleep apnea. .  History of Present Illness: the pt comes in today for f/u of her known osa.  She is wearing cpap compliantly, and her latest download shows this.  She has no air leaks of significance, and no significant breakthru apneas.  Everything on the download suggests she is doing well with the cpap.  Despite this, she is feeling "tired", but is very stressed right now because of her recent diagnosis of breast cancer.  She continues to take singulair for her AR symptoms, and thinks this has helped her breathing as well.   Current Medications (verified): 1)  Albuterol 90 Mcg/act Aers (Albuterol) .... Inhale 2 Puff As Directed Every Six Hours 2)  Levoxyl 25 Mcg  Tabs (Levothyroxine Sodium) .Marland Kitchen.. 1po Once Daily 3)  Nexium 40 Mg  Cpdr (Esomeprazole Magnesium) .... Take 1 Tablet By Mouth Once A Day 4)  Caltrate 600+d 600-400 Mg-Unit  Tabs (Calcium Carbonate-Vitamin D) .... Take 1 Tablet By Mouth Once A Day 5)  Singulair 10 Mg  Tabs (Montelukast Sodium) .... One By Mouth Daily As Needed 6)  Vitamin D 1000 Unit Tabs (Cholecalciferol) .... Take 1 Tablet By Mouth Once A Day 7)  Centrum Silver  Tabs (Multiple Vitamins-Minerals) .... Take 1 Tablet By Mouth Once A Day 8)  Motrin Ib 200 Mg Tabs (Ibuprofen) .... As Directed 9)  Pravastatin Sodium 80 Mg Tabs (Pravastatin Sodium) .Marland Kitchen.. 1 Qhs 10)  Klor-Con M10 10 Meq Cr-Tabs (Potassium Chloride Crys Cr) .Marland Kitchen.. 1 Qd 11)  Hyzaar 50-12.5 Mg Tabs (Losartan Potassium-Hctz) .Marland Kitchen.. 1 Qd  Allergies (verified): 1)   ! 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.2) OBSTRUCTIVE SLEEP APNEA (ICD-327.23) FATIGUE (ICD-780.79) OSTEOPENIA (ICD-733.90) GOITER, MULTINODULAR (ICD-241.1) ALLERGIC RHINITIS (ICD-477.9) ANEMIA-IRON DEFICIENCY (ICD-280.9) GLUCOSE INTOLERANCE (ICD-271.3) ANXIETY (ICD-300.00) HYPERSOMNIA (ICD-780.54) MYOFASCIAL PAIN SYNDROME (ICD-729.1) DEPRESSION (ICD-311) CARPAL TUNNEL SYNDROME, BILATERAL (ICD-354.0) IRON DEFICIENCY (ICD-280.9) GERD (ICD-530.81) HIATAL HERNIA (ICD-553.3) COLONIC POLYPS (ICD-211.3) DIVERTICULOSIS, COLON (ICD-562.10) HYPERLIPIDEMIA (ICD-272.4) HYPERTENSION (ICD-401.9) HYPOTHYROIDISM (ICD-244.9) ASTHMA (ICD-493.90)    Past Surgical History: hysterectomy Carpal tunnel release s/p urethral dilation s/p breast biopsy - neg x 2 Cataract extraction L breast lumpectomy April 2011  Vital Signs:  Patient profile:   65 year old female Height:      62 inches Weight:      183 pounds BMI:     33.59 O2 Sat:      96 % on Room air Temp:     98.4 degrees F oral Pulse rate:   74 / minute BP sitting:   124 / 62  (left arm) Cuff size:   regular  Vitals Entered By: Arman Filter LPN (July 04, 2009 11:23 AM)  O2 Flow:  Room air CC: Pt is here for a routine f/u appt.  Pt states she was recently dx with breast cancer- had  lumpectomy and is now seeing Dr. Pierce Crane for chemo.  Pt states she is wearing her cpap machine most nights- approx 6 hours per night.  Pt states she needs a new mask- current mask causes sore spot on nose.  Pt does c/o feeling tired and is unsure if this is d/t the cancer or sleep apnea.  Comments Medications reviewed with patient Arman Filter LPN  July 04, 2009 11:34 AM     Physical Exam  General:  obese female in nad Nose:  no skin breakdown or pressure necrosis from cpap mask nasal airway patent, no purulence Mouth:  clear Lungs:  clear to auscultation Heart:  rrr Extremities:  no edema or cyanosis Neurologic:  alert, does not appear sleepy, moves all 4.   Impression & Recommendations:  Problem # 1:  OBSTRUCTIVE SLEEP APNEA (ICD-327.23) the pt's download shows good compliance, no mask leaks, adequate pressure with no significant breakthru apnea.  I suspect that her "tiredness" is related to the stress of her recent diagnosis of breast cancer.  She is to start chemo soon.  I have asked her to continue with cpap, and will send an order to dme to get her a new mask.    Problem # 2:  DYSPNEA (ICD-786.05)  the pt feels she has no issues with this as long as she takes singulair.  She does not have asthma by my evaluation, but did have a very hypersensitized upper airway by methacholine challenge with simple saline that also revealed an abnormal flow volume loop.  I have explained to her this is upper airway, not lower, and I suspect related to reflux and allergic rhinitis with postnasal drip.  She had bronchoscopy for an airway exam, and no structural abnormalities were noted.  I have asked her to f/u with Dr. [place] Callas for her allergy issues.  Other Orders: Est. Patient Level III (16109) DME Referral (DME)  Patient Instructions: 1)  stay on cpap, work on weight loss 2)  will give you a prescription for singulair for 30 days until you can get back to your allergist. 3)  followup with me in one year.  Prescriptions: SINGULAIR 10 MG  TABS (MONTELUKAST SODIUM) One by mouth daily as needed  #30 x 0   Entered and Authorized by:   Barbaraann Share MD   Signed by:   Barbaraann Share MD on 07/04/2009   Method used:   Print then Give to Patient   RxID:   [id number]

## 2010-02-20 NOTE — Miscellaneous (Signed)
Summary: Appointment Canceled  Appointment status changed to canceled by [employer] on 08/21/2009 1:14 PM.  Cancellation Comments --------------------- appt @ 4:30/echo/pre chemo  Appointment Information ----------------------- Appt Type:  CARDIOLOGY ANCILLARY VISIT      Date:  Monday, August 21, 2009      Time:  3:00 PM for 60 min   Urgency:  Routine   Made By:  Pearson Grippe  To Visit:  [id number]    Reason:  appt @ 4:30/echo/pre chemo  Appt Comments ------------- -- 08/21/09 13:14: (CEMR) CANCELED -- appt @ 4:30/echo/pre chemo -- 08/09/09 9:14: (CEM[facility]) BOOKED -- Routine CARDIOLOGY ANCILLARY VISIT at 08/21/2009 3:00 PM for 60 min appt @ 4:30/echo/pre chemo

## 2010-02-20 NOTE — Progress Notes (Signed)
Summary: singular  Phone Note Refill Request Message from:  Fax from Pharmacy on June 26, 2009 9:28 AM  Refills Requested: Medication #1:  SINGULAIR 10 MG  TABS One by mouth daily as needed  Method Requested: Electronic Initial call taken by: Orlan Leavens,  June 26, 2009 9:29 AM    Prescriptions: SINGULAIR 10 MG  TABS (MONTELUKAST SODIUM) One by mouth daily as needed  #30 x 6   Entered by:   Orlan Leavens   Authorized by:   Minus Breeding MD   Signed by:   Orlan Leavens on 06/26/2009   Method used:   Electronically to        [facility]  [phone]* (retail)       [address]       [place], [place]  [address]       Ph: [phone] or [phone]       Fax: [phone]   RxID:   [id number]

## 2010-02-20 NOTE — Letter (Signed)
[facility]   Imported By: Sherian Rein 08/14/2009 14:41:48  _____________________________________________________________________  External Attachment:    Type:   Image     Comment:   External Document

## 2010-02-20 NOTE — Letter (Signed)
Summary: [facility]  [facility]   Imported By: Sherian Rein 06/01/2009 10:08:39  _____________________________________________________________________  External Attachment:    Type:   Image     Comment:   External Document

## 2010-02-20 NOTE — Letter (Signed)
Summary: [facility]  [facility]   Imported By: Lester Brumley 09/29/2009 11:06:13  _____________________________________________________________________  External Attachment:    Type:   Image     Comment:   External Document

## 2010-02-20 NOTE — Miscellaneous (Signed)
Summary: Lab results   Clinical Lists Changes  Observations: Added new observation of BILI TOTAL: 0.5 mg/dL (16/10/9602 54:09) Added new observation of SGPT (ALT): 23 units/L (05/25/2009 10:15) Added new observation of SGOT (AST): 24 units/L (05/25/2009 10:15) Added new observation of GFRAA: >60 mL/min (05/25/2009 10:15) Added new observation of GFR: >60 mL/min (05/25/2009 10:15) Added new observation of CALCIUM: 10.0 mg/dL (81/19/1478 29:56) Added new observation of CO2 PLSM/SER: 28 meq/L (05/25/2009 10:15) Added new observation of CL SERUM: 105 meq/L (05/25/2009 10:15) Added new observation of K SERUM: 4.6 meq/L (05/25/2009 10:15) Added new observation of NA: 143 meq/L (05/25/2009 10:15) Added new observation of CREATININE: 0.7 mg/dL (21/30/8657 84:69) Added new observation of BG RANDOM: 96 mg/dL (62/95/2841 32:44)      -  Date:  05/25/2009    BG Random: 96    Creatinine: 0.7    Sodium: 143    Potassium: 4.6    Chloride: 105    CO2 Total: 28    Calcium: 10.0    GFR(Non African American): >60    GFR(African American): >60    AST: 24    ALT: 23    T Bili: 0.5

## 2010-02-20 NOTE — Progress Notes (Signed)
---- Converted from flag ---- ---- 10/02/2009 12:06 PM, Edman Circle wrote: appt 10/17 @ 4:30  ---- 10/02/2009 11:43 AM, Dagoberto Reef wrote: Thanks  ---- 10/02/2009 11:40 AM, Minus Breeding MD wrote: The following orders have been entered for this patient and placed on Admin Hold:  Type:     Referral       Code:   Cardiology Description:   Cardiology Referral Order Date:   10/02/2009   Authorized By:   Minus Breeding MD Order #:   [id number] Clinical Notes: ------------------------------  Appended Document: [facility]     Past History:  Past Medical History: Current Problems:  HYPERTENSION (ICD-401.9) HYPERLIPIDEMIA (ICD-272.4) DYSPNEA (ICD-786.05) EDEMA (ICD-782.3) BREAST CANCER (ICD-174.9) COUGH (ICD-786.2) HYPOKALEMIA (ICD-276.8) HEPATOTOXICITY, DRUG-INDUCED, RISK OF (ICD-V58.69) MICROSCOPIC HEMATURIA (ICD-599.72) UNSPECIFIED OSTEOPOROSIS (ICD-733.00) MYALGIA (ICD-729.1) HEADACHE (ICD-784.0) ENCOUNTER FOR LONG-TERM USE OF OTHER MEDICATIONS (ICD-V58.69) ROUTINE GENERAL MEDICAL EXAM@HEALTH  CARE FACL (ICD-V70.0) CYSTITIS, CHRONIC (ICD-595.2) NAUSEA (ICD-787.02) OBSTRUCTIVE SLEEP APNEA (ICD-327.23) FATIGUE (ICD-780.79) OSTEOPENIA (ICD-733.90) GOITER, MULTINODULAR (ICD-241.1) ALLERGIC RHINITIS (ICD-477.9) ANEMIA-IRON DEFICIENCY (ICD-280.9) GLUCOSE INTOLERANCE (ICD-271.3) ANXIETY (ICD-300.00) HYPERSOMNIA (ICD-780.54) MYOFASCIAL PAIN SYNDROME (ICD-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 Pcn   Family History: father with colon cancer 27 yo brother  and sister with HTN aunt with thyroid disease son with ulcerative colitis arthritis prostate cancer asthma: son, mother cancer: maternal grandfather (stomach)  Social History: Alcohol use-no Married retired Haematologist - [employer] 3 children Never Smoked  Exercise Stress Test  Procedure date:  03/13/2006  Findings:        EXERCISE TREADMILL   SIOMARA, BURKEL                       MRN:          [id number]  DATE:03/13/2006                            DOB:          April 29, 1945    INDICATIONS:  This very nice lady is 52.  She has fairly significant  fatigue and some shortness of breath.  The current study was done to  evaluate the possibility of coronary ischemia.   The patient exercised today on the Bruce protocol.  Total exercise was 6  minutes.  A MET level was 7.1.  Maximum heart rate was 126 or 79% of age  predicted maximum.  Blood pressure at baseline was 124/73, pulse 76.  At  maximum stress, the heart rate reached 122 and the blood pressure rose  appropriately with exercise.  The exercise blood pressure response was  normal.   Resting electrocardiogram demonstrates normal sinus rhythm at maximum  stress.  There did not appear to be significant horizontal ST  depression.  There was minimal J point depression.  This was not felt to  be significant.  The study was nondiagnostic due to failure to achieve  85% of age predicted maximum, but with moderate work load and no  evidence of exercise induced ischemia at this workload.   RECOMMENDATIONS:  1. Basic metabolic panel and magnesium level.  2. I prescribed regular exercise program that should be achieve  at      least on a three time per week basis.  3. I will have her return to clinic in one month and we will do a 2      dimensional echocardiogram to reassess her overall situation.     Arturo Morton. Riley Kill, MD, [facility]  Electronically Signed    Echocardiogram  Procedure date:  04/14/2006  Findings:         SUMMARY   -  Overall left ventricular systolic function was normal. Left         ventricular ejection fraction was estimated to be 65 %. There         were no left ventricular regional wall motion abnormalities.         There was mild focal basal septal hypertrophy.   -  Left atrial size was at the upper limits of normal.    ---------------------------------------------------------------   Prepared and Electronically Authenticated by   Nicholes Mango MD   Confirmed 14-Apr-2006 15:04:02   Echocardiogram  Procedure date:  09/09/2005  Findings:        SUMMARY   -  The left ventricle was mildly dilated. Overall left ventricular         systolic function was normal. Left ventricular ejection         fraction was estimated to be 60 %. There were no left         ventricular regional wall motion abnormalities. There was         mild asymmetric septal hypertrophy.   -  The aortic valve was mildly calcified.   -  The left atrium was mildly dilated.   IMPRESSIONS   -  There was no echocardiographic evidence for a cardiac source of         embolism.    ---------------------------------------------------------------   Prepared and Electronically Authenticated by   Charlton Haws M.D.   Confirmed 09-Sep-2005 13:49:00   CXR  Procedure date:  07/13/2009  Findings:       Clinical Data: Chest tightness.  History breast cancer which the   patient is currently undergoing chemotherapy.  Asymptomatic   positive PPD.    CHEST - 2 VIEW 07/13/2009:    Comparison: Two-view chest x-ray 03/13/2009 and 04/27/2002.    Findings: Heart size normal and stable.  Thoracic aorta tortuous,   unchanged.  Hilar and mediastinal contours otherwise unremarkable.   Lungs clear.  Bronchovascular markings normal.  No pleural   effusions.  Eventration of the right anterior hemidiaphragm,   unchanged.  Degenerative changes throughout the thoracic spine.    IMPRESSION:   No acute cardiopulmonary disease.  No  evidence of metastasis.    Read By:  Arnell Sieving,  M.D.   Released By:  Arnell Sieving,  M.D.    CXR  Procedure date:  03/13/2009  Findings:       Clinical Data: Cough.  Fever.  Asthma.    CHEST - 2 VIEW    Comparison: 04/27/2002    Findings: Heart size is normal.  The aorta is unfolded. The lungs   are clear.  No effusions.  Ordinary degenerative changes effect the   spine.    IMPRESSION:   No active disease.    Read By:  Thomasenia Sales,  M.D.   Released By:  Thomasenia Sales,  M.D.    Nuclear Study  Procedure date:  08/17/2003  Findings:      INTERPRETATION:  Clinically negative for ischemia. Electrocardiographic  negative for ischemia. Perfusion data are essentially negative for ischemia.   Deberah Castle, MD.  Carotid Doppler  Procedure date:  12/19/1995  Findings:      Impression:  No significant ICA stenosis or plaque bilaterally.  Charlton Haws. MD.

## 2010-02-20 NOTE — Letter (Signed)
Summary: [facility]  [facility]   Imported By: Sherian Rein 12/12/2009 14:57:49  _____________________________________________________________________  External Attachment:    Type:   Image     Comment:   External Document

## 2010-02-20 NOTE — Letter (Signed)
Summary: [facility]  Breast Clinic [facility]   Imported By: Lester [place] 06/07/2009 08:57:28  _____________________________________________________________________  External Attachment:    Type:   Image     Comment:   External Document

## 2010-02-21 ENCOUNTER — Encounter: Payer: Self-pay | Admitting: Radiation Oncology

## 2010-02-22 NOTE — Letter (Signed)
Summary: General Surgery/[facility]  General Surgery/[facility]   Imported By: Lester [place] 01/25/2010 09:29:19  _____________________________________________________________________  External Attachment:    Type:   Image     Comment:   External Document

## 2010-02-22 NOTE — Letter (Signed)
Summary: [facility]  [facility]   Imported By: Lester [place] 02/02/2010 10:25:51  _____________________________________________________________________  External Attachment:    Type:   Image     Comment:   External Document

## 2010-03-12 ENCOUNTER — Telehealth: Payer: Self-pay | Admitting: Endocrinology

## 2010-03-20 ENCOUNTER — Other Ambulatory Visit: Payer: Self-pay | Admitting: Radiation Oncology

## 2010-03-20 DIAGNOSIS — N6320 Unspecified lump in the left breast, unspecified quadrant: Secondary | ICD-10-CM

## 2010-03-20 DIAGNOSIS — N6310 Unspecified lump in the right breast, unspecified quadrant: Secondary | ICD-10-CM

## 2010-03-20 NOTE — Progress Notes (Signed)
Summary: BP?  Phone Note Call from Patient Call back at [facility] Phone [phone]   Caller: Patient Summary of Call: Pt called stating that she has been experiencing severe HA in the evenings, increased edema of the ankles and her BP has been 140/70s average. Pt is concerned thats he should be restarted on a BP medication and if MD agrees she is requesting a cheaper alternative to Micardis. Please advise Initial call taken by: Margaret Pyle, CMA,  March 12, 2010 11:22 AM  Follow-up for Phone Call        please advise ov to address Follow-up by: Minus Breeding MD,  March 12, 2010 12:05 PM  Additional Follow-up for Phone Call Additional follow up Details #1::        Pt advised and states she will call back to schedule appt. Additional Follow-up by: Margaret Pyle, CMA,  March 12, 2010 2:37 PM

## 2010-03-22 ENCOUNTER — Ambulatory Visit: Payer: MEDICARE | Attending: Radiation Oncology | Admitting: Radiation Oncology

## 2010-03-23 ENCOUNTER — Other Ambulatory Visit: Payer: Self-pay | Admitting: Radiation Oncology

## 2010-03-23 ENCOUNTER — Ambulatory Visit
Admission: RE | Admit: 2010-03-23 | Discharge: 2010-03-23 | Disposition: A | Discharge status: Home / Self Care | Payer: MEDICARE | Source: Ambulatory Visit | Attending: Radiation Oncology | Admitting: Radiation Oncology

## 2010-03-23 ENCOUNTER — Other Ambulatory Visit: Payer: Self-pay

## 2010-03-23 DIAGNOSIS — N6310 Unspecified lump in the right breast, unspecified quadrant: Secondary | ICD-10-CM

## 2010-03-23 DIAGNOSIS — N6320 Unspecified lump in the left breast, unspecified quadrant: Secondary | ICD-10-CM

## 2010-03-26 ENCOUNTER — Other Ambulatory Visit: Payer: Self-pay

## 2010-04-09 ENCOUNTER — Telehealth: Payer: Self-pay | Admitting: Pulmonary Disease

## 2010-04-10 ENCOUNTER — Other Ambulatory Visit: Payer: Self-pay | Admitting: Oncology

## 2010-04-10 ENCOUNTER — Encounter (HOSPITAL_BASED_OUTPATIENT_CLINIC_OR_DEPARTMENT_OTHER): Payer: MEDICARE | Admitting: Oncology

## 2010-04-10 DIAGNOSIS — C50419 Malignant neoplasm of upper-outer quadrant of unspecified female breast: Secondary | ICD-10-CM

## 2010-04-10 DIAGNOSIS — R209 Unspecified disturbances of skin sensation: Secondary | ICD-10-CM

## 2010-04-10 LAB — CBC WITH DIFFERENTIAL/PLATELET
EOS%: 2.1 % (ref 0.0–7.0)
Eosinophils Absolute: 0.2 10*3/uL (ref 0.0–0.5)
LYMPH%: 32.9 % (ref 14.0–49.7)
MCH: 29.2 pg (ref 25.1–34.0)
MCV: 86.8 fL (ref 79.5–101.0)
MONO%: 7.3 % (ref 0.0–14.0)
NEUT#: 4.3 10*3/uL (ref 1.5–6.5)
Platelets: 259 10*3/uL (ref 145–400)
RBC: 3.87 10*6/uL (ref 3.70–5.45)

## 2010-04-11 LAB — COMPREHENSIVE METABOLIC PANEL
AST: 21 U/L (ref 0–37)
Alkaline Phosphatase: 84 U/L (ref 39–117)
BUN: 14 mg/dL (ref 6–23)
Glucose, Bld: 130 mg/dL — ABNORMAL HIGH (ref 70–99)
Sodium: 143 mEq/L (ref 135–145)
Total Bilirubin: 0.4 mg/dL (ref 0.3–1.2)
Total Protein: 6.9 g/dL (ref 6.0–8.3)

## 2010-04-11 LAB — VITAMIN D 25 HYDROXY (VIT D DEFICIENCY, FRACTURES): Vit D, 25-Hydroxy: 47 ng/mL (ref 30–89)

## 2010-04-19 NOTE — Progress Notes (Signed)
  Phone Note Call from Patient   Caller: Janmarie Call For: Jenascia Bumpass Summary of Call: pt feels like her cpap needs to be checked she has to get new cpa supplies will need an order in pcc box for this  Initial call taken by: Oneita Jolly,  April 09, 2010 3:08 PM  Follow-up for Phone Call        sent Follow-up by: Barbaraann Share MD,  April 09, 2010 4:47 PM

## 2010-05-03 ENCOUNTER — Other Ambulatory Visit: Payer: Self-pay | Admitting: Endocrinology

## 2010-05-07 ENCOUNTER — Ambulatory Visit (INDEPENDENT_AMBULATORY_CARE_PROVIDER_SITE_OTHER): Payer: MEDICARE | Admitting: Endocrinology

## 2010-05-07 ENCOUNTER — Encounter: Payer: Self-pay | Admitting: Endocrinology

## 2010-05-07 ENCOUNTER — Telehealth: Payer: Self-pay | Admitting: *Deleted

## 2010-05-07 DIAGNOSIS — J309 Allergic rhinitis, unspecified: Secondary | ICD-10-CM

## 2010-05-07 DIAGNOSIS — I10 Essential (primary) hypertension: Secondary | ICD-10-CM

## 2010-05-07 MED ORDER — AZITHROMYCIN 500 MG PO TABS: 500.0000 mg | ORAL_TABLET | Freq: Every day | ORAL | Status: AC

## 2010-05-07 MED ORDER — ATOVAQUONE-PROGUANIL HCL 62.5-25 MG PO TABS: 1.0000 | ORAL_TABLET | Freq: Every day | ORAL | Status: DC

## 2010-05-07 NOTE — Telephone Encounter (Signed)
Th "lorazepam" medication you take is good for this.  Try taking 1/2 pill prn for anxiety

## 2010-05-07 NOTE — Patient Instructions (Addendum)
You do not need blood pressure medication for now. Try store-brand cetirizine 10 mg daily.  i have sent prescriptions to your pharmacy for malaria prevention, and in case you get diarrhea.   Please schedule a regular physical for when you return.

## 2010-05-07 NOTE — Telephone Encounter (Signed)
Patient requesting rx to take while traveling. She is going "back home" and has anxiety over travel. Please advise.

## 2010-05-07 NOTE — Progress Notes (Signed)
  Subjective:    Patient ID: [name] [name], female    DOB: [birth year], 65 y.o.   MRN: [id number]  HPI Pt states few weeks of slight rhinorrhea from the nose, and assoc fatigue.  She wants to start xyzal.   Pt will go to [place] in 3 weeks.   She did not start bp med   Review of Systems Denies earache and nasal congestion    Objective:   Physical Exam GENERAL: no distress head: no deformity eyes: no periorbital swelling, no proptosis external nose and ears are normal mouth: no lesion seen Ear:  eac's and tm's are normal       Assessment & Plan:  Htn, improved off meds Allergic rhinitis, needs increased rx Travel prophylaxis is rx'ed today

## 2010-05-08 ENCOUNTER — Other Ambulatory Visit: Payer: Self-pay | Admitting: Endocrinology

## 2010-05-08 MED ORDER — AMITRIPTYLINE HCL 50 MG PO TABS: 50.0000 mg | ORAL_TABLET | Freq: Every day | ORAL | Status: DC

## 2010-05-08 NOTE — Telephone Encounter (Signed)
i sent elavil rx Pt had pneumovax in 2006, so no need to repeat now

## 2010-05-08 NOTE — Telephone Encounter (Signed)
Pt advised and transferred to schedule nurse visit.

## 2010-05-08 NOTE — Telephone Encounter (Signed)
Pt states that Ativan does not help very much. Pt states that she was advised to request Elavil by her children who are physicians. Pt is also requesting Pneumovax as well. Please advise.

## 2010-05-11 ENCOUNTER — Ambulatory Visit (INDEPENDENT_AMBULATORY_CARE_PROVIDER_SITE_OTHER): Payer: MEDICARE

## 2010-05-11 ENCOUNTER — Encounter (HOSPITAL_BASED_OUTPATIENT_CLINIC_OR_DEPARTMENT_OTHER): Payer: MEDICARE | Admitting: Oncology

## 2010-05-11 DIAGNOSIS — Z23 Encounter for immunization: Secondary | ICD-10-CM

## 2010-05-11 DIAGNOSIS — R609 Edema, unspecified: Secondary | ICD-10-CM

## 2010-05-11 DIAGNOSIS — R209 Unspecified disturbances of skin sensation: Secondary | ICD-10-CM

## 2010-05-11 DIAGNOSIS — C50419 Malignant neoplasm of upper-outer quadrant of unspecified female breast: Secondary | ICD-10-CM

## 2010-05-14 ENCOUNTER — Telehealth: Payer: Self-pay

## 2010-05-14 ENCOUNTER — Other Ambulatory Visit: Payer: Self-pay | Admitting: Obstetrics & Gynecology

## 2010-05-14 ENCOUNTER — Other Ambulatory Visit: Payer: Self-pay | Admitting: Endocrinology

## 2010-05-14 MED ORDER — ATOVAQUONE-PROGUANIL HCL 250-100 MG PO TABS: 1.0000 | ORAL_TABLET | Freq: Every day | ORAL | Status: DC

## 2010-05-14 NOTE — Telephone Encounter (Signed)
Pt's daughter called very concerned that her mother was prescribed wrong dose of medication. Per daughter who is an Infectious Disease MD, she cannot understand why pt was Rxd pediatric dose of medication (ie. Interaction) and since pt has already paid for medications she wants MD to call pharmacy. Pt's daughter is requesting a call from Bettye Boeck to discuss.

## 2010-05-14 NOTE — Telephone Encounter (Signed)
Pt called stating she was given Rx for Malaria prevention at last OV. Pt showed medication (cost $90) to her daughter who is a MD and was advised that this was a very low dose, good for 65 year old child and will not help pt at all. Pt is concerned that she was prescribed a medication that will not help her after paying $90 for it. Pt is requesting advisement or a call to her daughter if possible.      Antony Contras MD [phone] Pager [facility] in [place])

## 2010-05-14 NOTE — Telephone Encounter (Signed)
i sent rx for higher dosage.

## 2010-05-15 NOTE — Telephone Encounter (Signed)
i called dtr 05/14/10.

## 2010-05-19 ENCOUNTER — Other Ambulatory Visit: Payer: Self-pay | Admitting: Endocrinology

## 2010-05-21 NOTE — Telephone Encounter (Signed)
Rx Done . 

## 2010-05-25 ENCOUNTER — Other Ambulatory Visit: Payer: Self-pay | Admitting: Endocrinology

## 2010-05-28 ENCOUNTER — Encounter: Payer: Self-pay | Admitting: Pulmonary Disease

## 2010-05-28 ENCOUNTER — Ambulatory Visit (INDEPENDENT_AMBULATORY_CARE_PROVIDER_SITE_OTHER): Payer: MEDICARE | Admitting: Pulmonary Disease

## 2010-05-28 ENCOUNTER — Telehealth: Payer: Self-pay | Admitting: Pulmonary Disease

## 2010-05-28 VITALS — BP 120/80 | HR 78 | Temp 98.4°F | Ht 62.0 in | Wt 183.0 lb

## 2010-05-28 DIAGNOSIS — G4733 Obstructive sleep apnea (adult) (pediatric): Secondary | ICD-10-CM

## 2010-05-28 DIAGNOSIS — R05 Cough: Secondary | ICD-10-CM

## 2010-05-28 DIAGNOSIS — R059 Cough, unspecified: Secondary | ICD-10-CM

## 2010-05-28 MED ORDER — AZITHROMYCIN 500 MG PO TABS: 500.0000 mg | ORAL_TABLET | Freq: Every day | ORAL | Status: AC

## 2010-05-28 MED ORDER — HYDROCOD POLST-CHLORPHEN POLST 10-8 MG/5ML PO LQCR: 5.0000 mL | Freq: Two times a day (BID) | ORAL | Status: DC | PRN

## 2010-05-28 MED ORDER — PREDNISONE 10 MG PO TABS: ORAL_TABLET | ORAL | Status: AC

## 2010-05-28 MED ORDER — MONTELUKAST SODIUM 10 MG PO TABS: 10.0000 mg | ORAL_TABLET | Freq: Every day | ORAL | Status: DC | PRN

## 2010-05-28 MED ORDER — METHYLPREDNISOLONE ACETATE 80 MG/ML IJ SUSP
80.0000 mg | Freq: Once | INTRAMUSCULAR | Status: AC
  Administered 2010-05-28: 80 mg via INTRAMUSCULAR

## 2010-05-28 NOTE — Assessment & Plan Note (Addendum)
Depo medrol 80 IM Codeine cough syrup Ct singulair Z-pak to take for change in color of sputum Aggressive treatment since she is planning to travel in 2 days & would like rapid relief. Doubt radiation pneumonitis here since beyond time frame. She is hesitant to obtian CXR today - will defer for now.

## 2010-05-28 NOTE — Patient Instructions (Signed)
SOlumedrol 80 mg IM  prednisone tabs - short course Tussionex cough syrup Antibiotic if green - yellow phlegm Codeine syrup Check out your cpap machine

## 2010-05-28 NOTE — Progress Notes (Signed)
  Subjective:    Patient ID: [name] [name], female    DOB: 04/12/1945, 65 y.o.   MRN: [id number]  HPI 65/F for FU of obstructive sleep apnea & 'asthma' Seasonal allergies improve with singulair.  She has a very hypersensitized upper airway by methacholine challenge with simple saline that also revealed an abnormal flow volume loop.  Impression has been  this is upper airway, not lower, and suspect related to reflux and allergic rhinitis with postnasal drip.  She had bronchoscopy for an airway exam, and no structural abnormalities were noted. Has  f/u with Dr. [place] Callas for her allergy issues.  05/28/2010 C/o Chest tightness & drycough.   Not improved by singulair x 10 ds Albuterol nebs did not work, mucinex did not provide relief.  C/o pain in ribs with excessive coughing. She has a trip planned to [place] in 2 days & is looking for rapid relief. Breast Ca was diagnosed in '11 , s/p RT last oct'11 ([facility])     Review of Systems Pt denies any significant  nasal congestion or excess secretions, fever, chills, sweats, unintended wt loss, pleuritic or exertional cp, orthopnea pnd or leg swelling.  Pt also denies any obvious fluctuation in symptoms with weather or environmental change or other alleviating or aggravating factors.    Pt denies any increase in rescue therapy over baseline, denies waking up needing it or having early am exacerbations or coughing/wheezing/ or dyspnea       Objective:   Physical Exam Gen. Pleasant, well-nourished, in no distress, normal affect, dry cough during exam ENT - no lesions, no post nasal drip Neck: No JVD, no thyromegaly, no carotid bruits Lungs: no use of accessory muscles, no dullness to percussion, clear without rales or rhonchi  Cardiovascular: Rhythm regular, heart sounds  normal, no murmurs or gallops, no peripheral edema Abdomen: soft and non-tender, no hepatosplenomegaly, BS normal. Musculoskeletal: No deformities, no cyanosis or clubbing Neuro:   alert, non focal        Assessment & Plan:

## 2010-05-28 NOTE — Telephone Encounter (Signed)
The pt c/o increased dry cough and rib pain, slight sob. Pt requests an appt before Wed. Since she will be leaving out of town for 6 weeks. Pt agrees to be seen by Dr. Vassie Loll since [facility] has no open appts.

## 2010-05-28 NOTE — Assessment & Plan Note (Signed)
She will get CPAP checked out prior to travel. Doubt this episode is related to Change made by DME to CPAP. Weight loss encouraged, compliance with goal of at least 4-6 hrs every night is the expectation. Advised against medications with sedative side effects - codeine ok for symptomatic relief Cautioned against driving when sleepy - understanding that sleepiness will vary on a day to day basis

## 2010-06-05 NOTE — Procedures (Signed)
NAME:  [name] [name], [name] [name] NO.:  [id number]   MEDICAL RECORD NO.:  [id number]          PATIENT TYPE:  OUT   LOCATION:  SLEEP CENTER                 FACILITY:  [facility]   PHYSICIAN:  Barbaraann Share, MD,FCCPDATE OF BIRTH:  05-06-43   DATE OF STUDY:  07/16/2007                            NOCTURNAL POLYSOMNOGRAM   REFERRING PHYSICIAN:   INDICATION FOR STUDY:  Hypersomnia with sleep apnea.   EPWORTH SLEEPINESS SCORE:  7.   SLEEP ARCHITECTURE:  The patient had a total sleep time of 325 minutes  with decreased REM as well as slow-wave sleep.  Sleep onset latency was  normal at 27 minutes and REM onset was prolonged at 132 minutes.  Sleep  efficiency was decreased at 83%.   RESPIRATORY DATA:  The patient was found to have 27 apneas and 67  hypopneas for an apnea/hypopnea index of 17 events per hour.  She was  also found to have 23 respiratory effort related arousals, giving her a  respiratory disturbance index of 22 events per hour.  The events were  not positional and there was moderate snoring noted throughout.   OXYGEN DATA:  The patient had an O2 desaturation as low as 78% with her  obstructive events.   CARDIAC DATA:  No clinically significant arrhythmias were noted.   MOVEMENT/PARASOMNIA:  The patient was found to have 68 leg jerks with  1.7 per hour resulting in arousal or awakening.   IMPRESSIONS/RECOMMENDATIONS:  1. Mild-to-moderate obstructive sleep apnea with an AHI of 17 events      per hour and an RDI of 22 events per hour.  There was O2      desaturation as low as 78%.  Treatment for this degree of sleep      apnea can include weight loss alone if applicable, upper airway      surgery, oral appliance and also CPAP.  Clinical correlation is      suggested.  2. Small numbers of periodic leg movements with very little sleep      disruption.      Barbaraann Share, MD,FCCP  Diplomate, American Board of Sleep  Medicine  Electronically Signed     KMC/MEDQ  D:  08/05/2007 20:55:56  T:  08/05/2007 16:10:96  Job:  [id number]

## 2010-06-05 NOTE — Op Note (Signed)
NAME:  [name] [name], [name] [name] NO.:  [id number]   MEDICAL RECORD NO.:  [id number]          PATIENT TYPE:  AMB   LOCATION:  CARD                         FACILITY:  [facility]   PHYSICIAN:  Barbaraann Share, MD,FCCPDATE OF BIRTH:  October 31, 1945   DATE OF PROCEDURE:  06/15/2008  DATE OF DISCHARGE:                               OPERATIVE REPORT   PROCEDURE:  Flexible fiberoptic bronchoscopy.   INDICATIONS:  Abnormal flow volume loops, worrisome for a variable  intrathoracic obstruction along with the complaint of dyspnea.   OPERATOR:  Barbaraann Share, MD,FCCP.   ANESTHESIA:  Demerol 50 mg IV, Versed 5 mg IV, and topical 1% lidocaine  to vocal cords and airways during the procedure.   DESCRIPTION OF PROCEDURE:  After obtaining informed consent and under  close cardiopulmonary monitoring, the above preop anesthesia was given  and the fiberoptic scope was passed through the right naris and into the  posterior pharynx where there was no lesions or other abnormalities  seen.  The vocal cords appeared to be within normal limits and moved  bilaterally on phonation.  There was no evidence for paradoxical vocal  cord motion, however, the patient was sedated.  The scope was then  passed into the trachea where it was examined very carefully along its  entire length down to the level of the carina, all of which was normal.  There was no evidence for tracheomalacia or tracheal obstruction.  The  right and left tracheobronchial trees were then examined serially at the  subsegmental level with no endobronchial abnormality being found.  Overall, the patient tolerated the procedure quite well and there were  no complications.      Barbaraann Share, MD,FCCP  Electronically Signed     KMC/MEDQ  D:  06/15/2008  T:  06/15/2008  Job:  [id number]

## 2010-06-05 NOTE — Assessment & Plan Note (Signed)
[name]                         GASTROENTEROLOGY OFFICE NOTE   [name] [name], [name] [name]                       MRN:          [id number]  DATE:06/24/2006                            DOB:          [birth year]    [name] [name] is a very nice 65 year old [place] female with history of  gastroesophageal reflux controlled on Nexium 40 mg daily, initial upper  endoscopy 1995 apparently showed hiatal hernia. We have not found that  report in her chart. The procedure was done by Dr. Victorino Dike. In 2003  she was evaluated for reflux again and had a barium swallow which again  showed hiatal hernia with reflux. She also had a colonoscopy at that  time showing polyps of the left and transverse colon but again I can not  find the pathology report from these polyps. The colonoscopy report  indicates the patient  had edematous polyps in the past.   OTHER MEDICAL PROBLEMS:  Include; thyroid replacement, height blood  pressure, she is seeing Dr. Riley Kill.   PHYSICAL EXAMINATION:  Blood pressure 104/60, pulse 64, and weight 177  pounds. She was alert, oriented, in no distress.  LUNGS: Clear to auscultation.  COR: Normal S1, S2.  ABDOMEN: Soft, nontender with normoactive bowel sounds. Liver edge at  costal margin. There was minimal tenderness in right lower quadrant.  RECTAL EXAM: Not repeated.   IMPRESSION:  25. A 65 year old female with a history of edematous polyp of the      colon. Last colonoscopy 2003. She is due for another colonoscopy.  2. Gastroesophageal reflux disease, under good control on proton pump      inhibitor.  3. History of iron deficiency.   PLAN:  1. Recheck her CBC, iron, iron studies today.  2. Colonoscopy scheduled for July 09, 2006 with routine colonoscopy      prep.     Hedwig Morton. Juanda Chance, MD  Electronically Signed    DMB/MedQ  DD: 06/24/2006  DT: 06/24/2006  Job #: [id number]   cc:   Gregary Signs A. Everardo All, MD

## 2010-06-08 NOTE — Consult Note (Signed)
[facility]                            ENDOCRINOLOGY CONSULTATION   SABREEN, KITCHEN                       MRN:          [id number]  DATE:08/29/2005                            DOB:          May 05, 1945    REASON FOR VISIT:  Followup abnormal chest x-ray.   HISTORY OF PRESENT ILLNESS:  This is a 65 year old woman who recently had a  chest x-ray done by Dr. [place] Callas because her respiratory symptoms were slow to  respond to therapy for allergy and allergic symptoms.  She also states she  had edema several weeks ago, but this has resolved without any therapy.   PAST MEDICAL HISTORY:  An echocardiogram in 2003 showed slight left  ventricular hypertrophy.   REVIEW OF SYSTEMS:  Denies any fever or chest pain.   PHYSICAL EXAMINATION:  VITALS:  Blood pressure 139/78.  Heart rate 73.  Temperature 99.1.  Weight 186.  GENERAL:  No distress.  CHEST:  Clear to auscultation.  EXTREMITIES:  No edema today.  Chest x-ray forwarded by Dr.  Callas shows prominence of the left ventricle.   IMPRESSION:  Prominence of the left ventricle which is probably due to  hypertension.   PLAN:  1. Because her last echo was about four years ago, I have requested it be      repeated.  2. Return in a few weeks.                                   Sean A. Everardo All, MD   SAE/MedQ  DD:  08/29/2005  DT:  08/29/2005  Job #:  [id number]

## 2010-06-08 NOTE — Procedures (Signed)
[facility]                              EXERCISE TREADMILL   BIRDENA, KINGMA                       MRN:          [id number]  DATE:03/13/2006                            DOB:          15-Jul-1945    INDICATIONS:  This very nice lady is 19.  She has fairly significant  fatigue and some shortness of breath.  The current study was done to  evaluate the possibility of coronary ischemia.   The patient exercised today on the Bruce protocol.  Total exercise was 6  minutes.  A MET level was 7.1.  Maximum heart rate was 126 or 79% of age  predicted maximum.  Blood pressure at baseline was 124/73, pulse 76.  At  maximum stress, the heart rate reached 122 and the blood pressure rose  appropriately with exercise.  The exercise blood pressure response was  normal.   Resting electrocardiogram demonstrates normal sinus rhythm at maximum  stress.  There did not appear to be significant horizontal ST  depression.  There was minimal J point depression.  This was not felt to  be significant.  The study was nondiagnostic due to failure to achieve  85% of age predicted maximum, but with moderate work load and no  evidence of exercise induced ischemia at this workload.   RECOMMENDATIONS:  1. Basic metabolic panel and magnesium level.  2. I prescribed regular exercise program that should be achieve at      least on a three time per week basis.  3. I will have her return to clinic in one month and we will do a 2      dimensional echocardiogram to reassess her overall situation.     Arturo Morton. Riley Kill, MD, [facility]  Electronically Signed    TDS/MedQ  DD: 03/17/2006  DT: 03/17/2006  Job #: [id number]

## 2010-06-08 NOTE — Assessment & Plan Note (Signed)
[name]                            CARDIOLOGY OFFICE NOTE   [name], [name] [name]                       MRN:          [id number]  DATE:03/13/2006                            DOB:          September 07, 1945    CHIEF COMPLAINT:  No energy.   HISTORY OF PRESENT ILLNESS:  [name] [name] is a 65 year old female who has  previously seen Dr. Antoine Poche. She has had a problem recently,  predominantly with not having much energy and being fatigued much of the  time. The patient has had a previous echocardiogram in August 2007 to  assess left ventricular function. At that time, there was mild  asymmetric hypertrophy with an ejection fraction of 60%. There was mild  calcification of the aortic valve. When she exercise, she feels  relatively fatigued, as she has trouble getting started.   PAST MEDICAL HISTORY:  Remarkable for hysterectomy. She has had two  calcium deposits evaluated in her breast. She has had carpal tunnel. She  has had a urethral dilatation.   ALLERGIES:  PENICILLIN   MEDICATIONS:  1. Levoxyl 25 mg daily.  2. Simvastatin 40 mg daily.  3. Singulair 10 mg daily.  4. Symbicort inhaler.  5. Fluticasone propionate spray.  6. Micardis HCT 40/12.5 mg daily.  7. Nexium 40 mg daily.   FAMILY HISTORY:  Her father died at 89 of possible colon cancer. Her  mother is alive at 31. She has a brother at 20 who has hypertension and  sisters with hypertension as well.   REVIEW OF SYSTEMS:  Remarkable for getting lightheaded when she coughs  or laughs. She has reading glasses. She has very mild dyspnea. She was  recently found to have some asthma. She has had problems with urination.  Her weight is varied, but not greatly increased. Otherwise, negative.   PHYSICAL EXAMINATION:  GENERAL: She is alert, oriented and with no  distress.  VITAL SIGNS: Weight 181, pulse 83, blood pressure 135/83 and equal in  both arms.  NECK: There are no carotid bruits.  LUNGS:  Fields are clear to auscultation and percussion.  CARDIAC: Rhythm is regular. Normal 1st and 2nd heart sounds. No murmurs,  rubs, or gallops.   EKG reveals normal sinus rhythm. It is essentially within normal limits.   IMPRESSION:  1. Fatigue, uncertain etiology.  2. Recent episode of asthma.  3. Perhaps mildly abnormal chest x-ray with cardiac enlargement.  4. Asthma.   PLAN:  We will do an exercise tolerance test. I will review her chart. A  regular exercise program would be valuable. We will reassess after the  exercise test is done.     Arturo Morton. Riley Kill, MD, [facility]  Electronically Signed    TDS/MedQ  DD: 03/17/2006  DT: 03/17/2006  Job #: [id number]

## 2010-06-08 NOTE — Assessment & Plan Note (Signed)
[facility]                            CARDIOLOGY OFFICE NOTE   [name] [name], [name] [name]                       MRN:          [id number]  DATE:04/14/2006                            DOB:          Jan 04, 1946    [name] [name] is in for followup.  Yesterday, she had some discomfort in the  mid-chest.  She did not have any shortness of breath.  She took some gas  pills and then subsequently felt lethargic thereafter.  She does however  feel fine now.  She has been walking on a regular basis.  She says that  since I spoke with her, she has gradually increased her activity, and  now is even doing an exercise bicycle.  She denies any exertion related  chest discomfort.  We reviewed a number of her laboratory studies today  and she is scheduled to see GI.  Her GI studies revealed 6 occult  negative stools, her BMET was normal as was her magnesium.  Her last  hemoglobin was 12.8.  Her iron and iron saturation were normal and her  MCV was normal.  Importantly, I should have a low ferritin level and  this was all checked by the dermatologist for unclear reasons.  She is  scheduled to see Dr. Victorino Dike in the near future.   MEDICATIONS:  1. Levoxyl 25 mg daily.  2. Simvastatin 40 mg daily.  3. Singulair 10 mg daily.  4. Symbicort inhaler p.r.n.  5. Fluticasone propionate spray p.r.n.  6. Micardis HCT 40/12.5.  7. Nexium 40 mg daily.  8. Calcium daily.   PHYSICAL EXAMINATION:  The blood pressure is 132/78, the pulse is 73,  the lung fields are clear, and the cardiac rhythm is regular.  Her  weight is down approximately 3 pounds from last fall.  The extremities  reveal no edema.   An EKG was done today because of the chest pain, it is entirely within  normal limits.   An echocardiogram was done today, but the official report is not  available yet.   The patient is stable, I will see her back in followup in 6 months.  I  will review the results of her echo once  they become available.     Arturo Morton. Riley Kill, MD, [facility]  Electronically Signed    TDS/MedQ  DD: 04/14/2006  DT: 04/14/2006  Job #: [id number]

## 2010-06-08 NOTE — Assessment & Plan Note (Signed)
[facility]                           GASTROENTEROLOGY OFFICE NOTE   [name] [name], [name] [name]                       MRN:          [id number]  DATE:10/30/2005                            DOB:          February 16, 1945    HISTORY OF PRESENT ILLNESS:  Patient comes in on October 30, 2005  complaining of cough, asthma-like symptoms.  Patient seeing Dr. Sidney Ace while Dr. Jethro Bolus is out of town she said and she has been  following with Dr. Warren Callas because she says Dr. Nira Retort doesn't take care of her  anymore, which she was somewhat saddened about.  But, in any case, she has  been taking a number of medicines and she has continued with this dry cough,  questionable fever and questioned about sinusitis and Dr. Avon Callas did order  CT scan done which she has not obtained as yet.  She said she would be  getting that some time in the near future.  She states she feels like she  has some drainage in the back of her throat.  She also wondered about  whether this might be related to GERD.  She denies any heartburn but says  she has some tightness in the chest.   MEDICATIONS:  1. Levoxyl.  2. Simvastatin.  3. Singulair.  4. Symbicort inhaler.  5. Fluticasone propionate spray.   PHYSICAL EXAMINATION:  VITAL SIGNS:  Weight 182.  Blood pressure 128/76,  pulse 60 and regular, color was normal.  HEENT:  Oropharynx is negative.  CHEST:  Clear.  HEART:  Reveals a regular rhythm.  ABDOMEN:  Soft.  No masses or organomegaly.  Nontender.   IMPRESSION:  1. Acute and chronic cough, questionable etiology.  Rule out      gastroesophageal reflux disease versus sinus drainage versus medication-      induced.  2. History of asthma.  3. Hypothyroidism.  4. Cardiac enlargement.  5. Hypertension.  6. Hyperlipidemia.   RECOMMENDATIONS:  Recommendation is to get her to start on Nexium 40 mg  b.i.d.  Scheduled her for upper endoscopy.  I also talked with Dr. [place] Callas  about  possibly putting her on some Levaquin 750 daily for 5 to 10 days  pending upon her response and he gave me the okay to do such.  He was  anxious to have her followup with the CT scan of her sinuses which I tried  to reinforce as well.  I gave her some samples of Nexium and ordered both  that and the Levaquin for her.            ______________________________  Ulyess Mort, MD      SML/MedQ  DD:  10/30/2005  DT:  11/01/2005  Job #:  [id number]   cc:   Sidney Ace, M.D. [facility]

## 2010-06-08 NOTE — Assessment & Plan Note (Signed)
[facility]                            CARDIOLOGY OFFICE NOTE   MEIGAN, PATES                       MRN:          [id number]  DATE:04/15/2006                            DOB:          02/24/1945    Ms. Notaro is in for a followup visit today.  The patient had a very mild  degree of some chest discomfort yesterday, and she took some extra  Nexium.  She felt a little bit lethargic afterwards but feels better.  In general, her overall exercise tolerance has improved.  She has been  walking in the manner that we had asked her to, and she has been getting  out a little bit more.  She underwent a two-dimensional echocardiogram  yesterday.  This demonstrated normal left ventricular size and function  with an ejection fraction of 65%.  There was mild focal basal septal  hypertrophy and the suggestion of mild diastolic dysfunction.  There was  normal aortic leaflet excursion, and no significant AS or AR.  The  mitral valve was structurally intact with no evidence of significant MS  or mitral regurgitation.  Right-sided chambers also were normal, and  there was not a suggestion of pulmonary hypertension.  Right-sided  appearances were normal, and the pericardium was normal as well.  Left  atrium was thought to be at the upper limits of normal.  The study was  read by Dr. Gala Romney.   On examination, the weight is 178 pounds; this is down about 3 pounds  from the last visit.  Blood pressure is 132/78, and the pulse is 73.  The lung fields are clear.  I cannot appreciate a significant murmur.   The electrocardiogram demonstrates normal sinus rhythm essentially  within normal limits.   I would encourage the patient to continue to remain active and continue  to walk.  On the treadmill, she did not have significant exercise-  induced chest pain.  She seems to be somewhat better.  We did have a  discussion about her husband today as she is concerned about his  overall  status.   I will see the patient back in followup in a few months or as needed.  Should she have any change in her symptom status, she is to contact me.  Of note, we also did talk about the fact that her hemoglobin was 12.8,  but she has had stools done and they have been negative.  Also, her  basic metabolic profile and magnesium were normal as well, and her  glucose was 95.     Arturo Morton. Riley Kill, MD, [facility]  Electronically Signed    TDS/MedQ  DD: 04/25/2006  DT: 04/25/2006  Job #: [id number]

## 2010-09-03 ENCOUNTER — Other Ambulatory Visit: Payer: Self-pay | Admitting: Endocrinology

## 2010-09-07 ENCOUNTER — Encounter (HOSPITAL_BASED_OUTPATIENT_CLINIC_OR_DEPARTMENT_OTHER): Payer: Medicare Other | Admitting: Oncology

## 2010-09-07 ENCOUNTER — Other Ambulatory Visit: Payer: Self-pay | Admitting: Oncology

## 2010-09-07 DIAGNOSIS — R609 Edema, unspecified: Secondary | ICD-10-CM

## 2010-09-07 DIAGNOSIS — C50419 Malignant neoplasm of upper-outer quadrant of unspecified female breast: Secondary | ICD-10-CM

## 2010-09-07 DIAGNOSIS — R209 Unspecified disturbances of skin sensation: Secondary | ICD-10-CM

## 2010-09-07 LAB — CBC WITH DIFFERENTIAL/PLATELET
Eosinophils Absolute: 0.2 10*3/uL (ref 0.0–0.5)
HCT: 33.7 % — ABNORMAL LOW (ref 34.8–46.6)
LYMPH%: 36.5 % (ref 14.0–49.7)
MONO#: 0.6 10*3/uL (ref 0.1–0.9)
NEUT#: 3.9 10*3/uL (ref 1.5–6.5)
NEUT%: 52.5 % (ref 38.4–76.8)
Platelets: 249 10*3/uL (ref 145–400)
RBC: 3.92 10*6/uL (ref 3.70–5.45)
WBC: 7.4 10*3/uL (ref 3.9–10.3)
lymph#: 2.7 10*3/uL (ref 0.9–3.3)

## 2010-09-07 LAB — COMPREHENSIVE METABOLIC PANEL
ALT: 26 U/L (ref 0–35)
AST: 25 U/L (ref 0–37)
Alkaline Phosphatase: 90 U/L (ref 39–117)
Glucose, Bld: 132 mg/dL — ABNORMAL HIGH (ref 70–99)
Potassium: 3.9 mEq/L (ref 3.5–5.3)
Sodium: 139 mEq/L (ref 135–145)
Total Bilirubin: 0.2 mg/dL — ABNORMAL LOW (ref 0.3–1.2)
Total Protein: 7.6 g/dL (ref 6.0–8.3)

## 2010-09-10 ENCOUNTER — Other Ambulatory Visit: Payer: Self-pay | Admitting: Oncology

## 2010-09-10 DIAGNOSIS — Z9889 Other specified postprocedural states: Secondary | ICD-10-CM

## 2010-09-10 DIAGNOSIS — Z853 Personal history of malignant neoplasm of breast: Secondary | ICD-10-CM

## 2010-10-06 ENCOUNTER — Other Ambulatory Visit: Payer: Self-pay | Admitting: Endocrinology

## 2010-10-16 ENCOUNTER — Other Ambulatory Visit: Payer: Self-pay | Admitting: Endocrinology

## 2010-10-23 ENCOUNTER — Encounter (HOSPITAL_BASED_OUTPATIENT_CLINIC_OR_DEPARTMENT_OTHER): Payer: Medicare Other | Admitting: Oncology

## 2010-10-23 ENCOUNTER — Other Ambulatory Visit: Payer: Self-pay

## 2010-10-23 ENCOUNTER — Other Ambulatory Visit: Payer: Self-pay | Admitting: Physician Assistant

## 2010-10-23 DIAGNOSIS — C50419 Malignant neoplasm of upper-outer quadrant of unspecified female breast: Secondary | ICD-10-CM

## 2010-10-23 DIAGNOSIS — R209 Unspecified disturbances of skin sensation: Secondary | ICD-10-CM

## 2010-10-23 DIAGNOSIS — R609 Edema, unspecified: Secondary | ICD-10-CM

## 2010-10-23 LAB — CBC & DIFF AND RETIC
BASO%: 0.6 % (ref 0.0–2.0)
EOS%: 3.2 % (ref 0.0–7.0)
LYMPH%: 43.7 % (ref 14.0–49.7)
MCH: 28.6 pg (ref 25.1–34.0)
MCHC: 33 g/dL (ref 31.5–36.0)
MONO#: 0.6 10*3/uL (ref 0.1–0.9)
Platelets: 233 10*3/uL (ref 145–400)
RBC: 3.98 10*6/uL (ref 3.70–5.45)
Retic %: 1.27 % (ref 0.70–2.10)
WBC: 7.3 10*3/uL (ref 3.9–10.3)

## 2010-10-23 LAB — CHCC SMEAR

## 2010-10-25 LAB — PROTEIN ELECTROPHORESIS, SERUM
Albumin ELP: 55.2 % — ABNORMAL LOW (ref 55.8–66.1)
Alpha-1-Globulin: 4.9 % (ref 2.9–4.9)
Total Protein, Serum Electrophoresis: 6.9 g/dL (ref 6.0–8.3)

## 2010-10-25 LAB — TSH: TSH: 3.01 u[IU]/mL (ref 0.350–4.500)

## 2010-10-25 LAB — VITAMIN B12: Vitamin B-12: 1381 pg/mL — ABNORMAL HIGH (ref 211–911)

## 2010-10-25 LAB — FOLATE RBC: RBC Folate: 937 ng/mL (ref >=366)

## 2010-11-02 ENCOUNTER — Encounter (HOSPITAL_BASED_OUTPATIENT_CLINIC_OR_DEPARTMENT_OTHER): Payer: Medicare Other | Admitting: Oncology

## 2010-11-02 DIAGNOSIS — D649 Anemia, unspecified: Secondary | ICD-10-CM

## 2010-11-05 ENCOUNTER — Telehealth: Payer: Self-pay | Admitting: Internal Medicine

## 2010-11-05 NOTE — Telephone Encounter (Signed)
Patient calling to schedule an OV with Dr. Juanda Chance. No new problems just needs an ov. Scheduled her on 11/27/10 at 2:30 PM.

## 2010-11-09 ENCOUNTER — Encounter (HOSPITAL_BASED_OUTPATIENT_CLINIC_OR_DEPARTMENT_OTHER): Payer: Medicare Other | Admitting: Oncology

## 2010-11-09 DIAGNOSIS — D649 Anemia, unspecified: Secondary | ICD-10-CM

## 2010-11-13 ENCOUNTER — Other Ambulatory Visit: Payer: Self-pay | Admitting: Pulmonary Disease

## 2010-11-13 ENCOUNTER — Ambulatory Visit (INDEPENDENT_AMBULATORY_CARE_PROVIDER_SITE_OTHER): Payer: Medicare Other

## 2010-11-13 DIAGNOSIS — Z23 Encounter for immunization: Secondary | ICD-10-CM

## 2010-11-15 ENCOUNTER — Encounter: Payer: Medicare Other | Admitting: Endocrinology

## 2010-11-27 ENCOUNTER — Ambulatory Visit (INDEPENDENT_AMBULATORY_CARE_PROVIDER_SITE_OTHER): Payer: Medicare Other | Admitting: Internal Medicine

## 2010-11-27 ENCOUNTER — Encounter: Payer: Self-pay | Admitting: Internal Medicine

## 2010-11-27 DIAGNOSIS — K219 Gastro-esophageal reflux disease without esophagitis: Secondary | ICD-10-CM

## 2010-11-27 DIAGNOSIS — D509 Iron deficiency anemia, unspecified: Secondary | ICD-10-CM

## 2010-11-27 MED ORDER — OMEPRAZOLE 20 MG PO CPDR: 20.0000 mg | DELAYED_RELEASE_CAPSULE | Freq: Two times a day (BID) | ORAL | Status: DC

## 2010-11-27 MED ORDER — PEG-KCL-NACL-NASULF-NA ASC-C 100 G PO SOLR: 1.0000 | Freq: Once | ORAL | Status: DC

## 2010-11-27 NOTE — Patient Instructions (Addendum)
We have sent the following medications to your pharmacy for you to pick up at your convenience: Prilosec 20 mg twice daily (in place of Nexium) You have been scheduled for an endoscopy and colonoscopy. Please follow the written instructions given to you at your visit today. Please pick up yourprep at the pharmacy within the next 2-3 days. CC: Dr Romero Belling, Dr Pierce Crane

## 2010-11-27 NOTE — Progress Notes (Signed)
[name] [name] 06/01/1945 MRN [id number]   History of Present Illness:  This is a 65 year old [place] woman who has had a history of gastroesophageal reflux evaluated with an upper endoscopy in 1995 and again in June 2008 with findings of mild inflammation at the GE junction. She had a 2 cm hiatal hernia. She is now having  soreness in her throat, regurgitation and reflux. She also has a history of an adenomatous polyp of the colon on a colonoscopy in June 2008. She was diagnosed with breast cancer in 2011 and underwent radiation and chemotherapy by Dr Donnie Coffin with resulting neuropathy. She has been off chemotherapy now for 1 year but her level of energy has remained very low and she was found to be iron deficient. Her hemoglobin in August 2012 was 10.9 and 4 weeks ago was 11.5. She was given an iron infusion and has been on iron supplements orally. She denies any visible blood per rectum. Patient has been on ibuprofen 2-4 a day and most recently on Aleve 2 a day for arthritis. She takes Nexium 40 mg a day but it has become rather expensive.   Past Medical History  Diagnosis Date  . BREAST CANCER 07/04/2009  . HYPERTENSION 06/13/2007  . HYPERLIPIDEMIA 06/13/2007  . GLUCOSE INTOLERANCE 06/26/2007  . UNSPECIFIED OSTEOPOROSIS 11/18/2008  . OBSTRUCTIVE SLEEP APNEA 08/06/2007  . GOITER, MULTINODULAR 06/26/2007  . HYPOTHYROIDISM 06/13/2007  . ANXIETY 06/26/2007  . Unspecified Iron Deficiency Anemia 06/13/2007  . CARPAL TUNNEL SYNDROME, BILATERAL 06/13/2007  . ASTHMA 06/13/2007  . GERD 06/13/2007  . HIATAL HERNIA 06/13/2007  . Microscopic hematuria 01/03/2009  . OSTEOPENIA 06/26/2007  . HYPERSOMNIA 06/26/2007  . Myalgia and myositis, unspecified     Myofascial pain syndrome  . Depression   . Chronic cystitis   . Hx of adenomatous colonic polyps   . H. pylori infection 2008  . Fatty liver    Past Surgical History  Procedure Date  . Abdominal hysterectomy   . Carpal tunnel release   . Urethral dilation    s/p  . Breast surgery     breast biopsy-neg x 2  . Cataract extraction   . Breast lumpectomy 04/2009    L breast   . Appendectomy   . Breast lumpectomy     right breast-cancerous    reports that she has never smoked. She has never used smokeless tobacco. She reports that she does not drink alcohol or use illicit drugs. family history includes Arthritis in her other; Asthma in her mother and son; Colon cancer (age of onset:82) in her father; Hypertension in her brother and sister; Prostate cancer in her other; Stomach cancer in her maternal grandfather; Thyroid disease in her other; and Ulcerative colitis in her son. Allergies  Allergen Reactions  . Penicillins         Review of Systems: Positive for reflux and heartburn. Denies abdominal pain.  The remainder of the 10 point ROS is negative except as outlined in H&P   Physical Exam: General appearance  Well developed, in no distress. Eyes- non icteric. HEENT nontraumatic, normocephalic. Mouth no lesions, tongue papillated, no cheilosis. Neck supple without adenopathy, thyroid not enlarged, no carotid bruits, no JVD. Lungs Clear to auscultation bilaterally. Cor normal S1, normal S2, regular rhythm, no murmur,  quiet precordium. Abdomen: Mild tenderness left and right middle quadrants. Normal active bowel sounds. No distention. Rectal: Solid Hemoccult-negative stool. Extremities no pedal edema. Skin no lesions. Neurological alert and oriented x 3. Psychological normal mood and  affect.  Assessment and Plan:  Problem #1 Iron deficiency anemia in the setting of Hemoccult-negative stool. Patient has been on ibuprofen and Aleve continuously now for several months and I suspect that could be causing gastropathy and microscopic GI blood loss. Her upper GI symptoms maybe cholecystitis operative as well. Because of a history of tubular adenomas x 2 and the fact that she really should be due for a recall colonoscopy within the next 10  months, we will at this time proceed with an upper endoscopy and colonoscopy for evaluation of iron deficiency anemia. She also requested to change from Nexium to reduce the cost of her medications and we will send her Prilosec 20 mg twice a day. I have asked her to reduce her Aleve and ibuprofen significantly.   11/27/2010 [name] [name]

## 2010-11-28 ENCOUNTER — Encounter: Payer: Self-pay | Admitting: Internal Medicine

## 2010-12-05 ENCOUNTER — Other Ambulatory Visit: Payer: Self-pay | Admitting: Oncology

## 2010-12-05 ENCOUNTER — Ambulatory Visit: Payer: Medicare Other | Admitting: Radiation Oncology

## 2010-12-10 ENCOUNTER — Other Ambulatory Visit: Payer: Self-pay | Admitting: Oncology

## 2010-12-10 ENCOUNTER — Telehealth: Payer: Self-pay | Admitting: *Deleted

## 2010-12-10 MED ORDER — LEVOXYL 25 MCG PO TABS: 25.0000 ug | ORAL_TABLET | Freq: Every day | ORAL | Status: DC

## 2010-12-10 NOTE — Telephone Encounter (Signed)
Rx sent to [facility], pt informed.

## 2010-12-24 ENCOUNTER — Telehealth: Payer: Self-pay | Admitting: *Deleted

## 2010-12-24 NOTE — Telephone Encounter (Signed)
Pt. Received letter from Dr. Flint Melter saying her mammogram was due soon.  Pt. Would like it done in [place].   Last mammogram is March 2012.  She is due to see Dr. Donnie Coffin in February 2013 and he can schedule this at that visit with results to Dr. Flint Melter.

## 2010-12-25 ENCOUNTER — Ambulatory Visit (INDEPENDENT_AMBULATORY_CARE_PROVIDER_SITE_OTHER): Payer: Medicare Other | Admitting: Endocrinology

## 2010-12-25 ENCOUNTER — Other Ambulatory Visit (INDEPENDENT_AMBULATORY_CARE_PROVIDER_SITE_OTHER): Payer: Medicare Other

## 2010-12-25 ENCOUNTER — Encounter: Payer: Self-pay | Admitting: Endocrinology

## 2010-12-25 DIAGNOSIS — M949 Disorder of cartilage, unspecified: Secondary | ICD-10-CM

## 2010-12-25 DIAGNOSIS — E785 Hyperlipidemia, unspecified: Secondary | ICD-10-CM

## 2010-12-25 DIAGNOSIS — R3129 Other microscopic hematuria: Secondary | ICD-10-CM

## 2010-12-25 DIAGNOSIS — I10 Essential (primary) hypertension: Secondary | ICD-10-CM

## 2010-12-25 DIAGNOSIS — K769 Liver disease, unspecified: Secondary | ICD-10-CM

## 2010-12-25 DIAGNOSIS — D649 Anemia, unspecified: Secondary | ICD-10-CM

## 2010-12-25 DIAGNOSIS — E739 Lactose intolerance, unspecified: Secondary | ICD-10-CM

## 2010-12-25 DIAGNOSIS — R609 Edema, unspecified: Secondary | ICD-10-CM

## 2010-12-25 DIAGNOSIS — E042 Nontoxic multinodular goiter: Secondary | ICD-10-CM

## 2010-12-25 DIAGNOSIS — Z79899 Other long term (current) drug therapy: Secondary | ICD-10-CM

## 2010-12-25 DIAGNOSIS — E876 Hypokalemia: Secondary | ICD-10-CM

## 2010-12-25 DIAGNOSIS — M899 Disorder of bone, unspecified: Secondary | ICD-10-CM

## 2010-12-25 LAB — URINALYSIS, ROUTINE W REFLEX MICROSCOPIC
Ketones, ur: NEGATIVE
Nitrite: NEGATIVE
Specific Gravity, Urine: 1.01 (ref 1.000–1.030)
Total Protein, Urine: NEGATIVE
pH: 6.5 (ref 5.0–8.0)

## 2010-12-25 LAB — HEPATIC FUNCTION PANEL
ALT: 40 U/L — ABNORMAL HIGH (ref 0–35)
AST: 28 U/L (ref 0–37)
Alkaline Phosphatase: 86 U/L (ref 39–117)
Bilirubin, Direct: 0.1 mg/dL (ref 0.0–0.3)
Total Protein: 7.4 g/dL (ref 6.0–8.3)

## 2010-12-25 LAB — LIPID PANEL
Total CHOL/HDL Ratio: 2
Triglycerides: 137 mg/dL (ref 0.0–149.0)

## 2010-12-25 LAB — BASIC METABOLIC PANEL
Chloride: 103 mEq/L (ref 96–112)
Potassium: 3.4 mEq/L — ABNORMAL LOW (ref 3.5–5.1)

## 2010-12-25 LAB — IBC PANEL: Saturation Ratios: 21 % (ref 20.0–50.0)

## 2010-12-25 LAB — HEMOGLOBIN A1C: Hgb A1c MFr Bld: 5.8 % (ref 4.6–6.5)

## 2010-12-25 MED ORDER — POTASSIUM CHLORIDE CRYS ER 10 MEQ PO TBCR: 10.0000 meq | EXTENDED_RELEASE_TABLET | Freq: Two times a day (BID) | ORAL | Status: DC

## 2010-12-25 MED ORDER — METHOCARBAMOL 500 MG PO TABS: 500.0000 mg | ORAL_TABLET | Freq: Four times a day (QID) | ORAL | Status: AC

## 2010-12-25 NOTE — Progress Notes (Signed)
Subjective:    Patient ID: [name] [name], female    DOB: 1945-08-06, 65 y.o.   MRN: [id number]  HPI here for regular wellness examination.  she's feeling pretty well in general, and says chronic med probs are stable, except as noted below.  Gyn is dr Seymour Bars. Past Medical History  Diagnosis Date  . BREAST CANCER 07/04/2009  . HYPERTENSION 06/13/2007  . HYPERLIPIDEMIA 06/13/2007  . GLUCOSE INTOLERANCE 06/26/2007  . UNSPECIFIED OSTEOPOROSIS 11/18/2008  . OBSTRUCTIVE SLEEP APNEA 08/06/2007  . GOITER, MULTINODULAR 06/26/2007  . HYPOTHYROIDISM 06/13/2007  . ANXIETY 06/26/2007  . Unspecified Iron Deficiency Anemia 06/13/2007  . CARPAL TUNNEL SYNDROME, BILATERAL 06/13/2007  . ASTHMA 06/13/2007  . GERD 06/13/2007  . HIATAL HERNIA 06/13/2007  . Microscopic hematuria 01/03/2009  . OSTEOPENIA 06/26/2007  . HYPERSOMNIA 06/26/2007  . Myalgia and myositis, unspecified     Myofascial pain syndrome  . Depression   . Chronic cystitis   . Hx of adenomatous colonic polyps   . H. pylori infection 2008  . Fatty liver     Past Surgical History  Procedure Date  . Abdominal hysterectomy   . Carpal tunnel release   . Urethral dilation     s/p  . Breast surgery     breast biopsy-neg x 2  . Cataract extraction   . Breast lumpectomy 04/2009    L breast   . Appendectomy   . Breast lumpectomy     right breast-cancerous    History   Social History  . Marital Status: Married    Spouse Name: N/A    Number of Children: 3  . Years of Education: N/A   Occupational History  . Retired    Social History Main Topics  . Smoking status: Never Smoker   . Smokeless tobacco: Never Used  . Alcohol Use: No  . Drug Use: No  . Sexually Active: Not on file   Other Topics Concern  . Not on file   Social History Narrative   Retired Teaching laboratory technician    Current Outpatient Prescriptions on File Prior to Visit  Medication Sig Dispense Refill  . albuterol (PROVENTIL,VENTOLIN) 90 MCG/ACT inhaler Inhale 2 puffs  into the lungs every 6 (six) hours as needed.        . Calcium Carbonate-Vitamin D (CALTRATE 600+D) 600-400 MG-UNIT per tablet Take 1 tablet by mouth daily.        . cholecalciferol (VITAMIN D) 1000 UNITS tablet Take 1,000 Units by mouth daily.        . furosemide (LASIX) 20 MG tablet TAKE ONE TABLET BY MOUTH DAILY.  30 tablet  2  . gabapentin (NEURONTIN) 100 MG tablet 2 tablets by mouth three times a day       . ibuprofen (ADVIL,MOTRIN) 200 MG tablet As directed       . LEVOXYL 25 MCG tablet Take 1 tablet (25 mcg total) by mouth daily.  90 each  1  . LORazepam (ATIVAN) 0.5 MG tablet TAKE ONE TABLET BY MOUTH EVERY 6 HOURS AS NEEDED  30 tablet  2  . Multiple Vitamins-Minerals (CENTRUM SILVER PO) Take 1 tablet by mouth daily.        Marland Kitchen omeprazole (PRILOSEC) 20 MG capsule Take 1 capsule (20 mg total) by mouth 2 (two) times daily.  60 capsule  2  . peg 3350 powder (MOVIPREP) 100 G SOLR Take 1 kit (100 g total) by mouth once.  1 kit  0  . pravastatin (PRAVACHOL) 80 MG tablet TAKE  ONE TABLET BY MOUTH AT BEDTIME  30 tablet  4  . pyridOXINE (VITAMIN B-6) 100 MG tablet Take 100 mg by mouth daily.        Marland Kitchen SINGULAIR 10 MG tablet TAKE ONE TABLET BY MOUTH EVERY DAY AS NEEDED  30 each  3  . vitamin B-12 (CYANOCOBALAMIN) 100 MCG tablet Take 50 mcg by mouth daily.        Marland Kitchen amitriptyline (ELAVIL) 50 MG tablet Take 1 tablet (50 mg total) by mouth at bedtime.  30 tablet  0    Allergies  Allergen Reactions  . Penicillins     Family History  Problem Relation Age of Onset  . Asthma Mother   . Colon cancer Father 63  . Hypertension Sister   . Hypertension Brother   . Ulcerative colitis Son   . Asthma Son   . Stomach cancer Maternal Grandfather   . Thyroid disease Other     Aunt  . Arthritis Other   . Prostate cancer Other     BP 126/72  Pulse 81  Temp(Src) 99.4 F (37.4 C) (Oral)  Ht 5' 2.5" (1.588 m)  Wt 177 lb 12 oz (80.627 kg)  BMI 31.99 kg/m2  SpO2 95%  Review of Systems  Constitutional:  Negative for fever.  HENT: Negative for hearing loss.   Eyes: Negative for visual disturbance.  Respiratory:       She has slight doe  Cardiovascular: Negative for chest pain.  Gastrointestinal: Negative for anal bleeding.  Genitourinary: Negative for hematuria.  Musculoskeletal: Positive for back pain.  Skin: Negative for rash.  Neurological: Negative for headaches.  Hematological: Bruises/bleeds easily.  Psychiatric/Behavioral: Negative for dysphoric mood.      Objective:   Physical Exam VS: see vs page GEN: no distress HEAD: head: no deformity eyes: no periorbital swelling, no proptosis external nose and ears are normal mouth: no lesion seen NECK: supple, thyroid is not enlarged CHEST WALL: no deformity LUNGS:  Clear to auscultation BREASTS:  No mass.  No d/c CV: reg rate and rhythm, no murmur ABD: abdomen is soft, nontender.  no hepatosplenomegaly.  not distended.  no hernia GENITALIA:  Normal external female.  Normal bimanual exam RECTAL: normal external and internal exam.  heme neg MUSCULOSKELETAL: muscle bulk and strength are grossly normal.  no obvious joint swelling.  gait is normal and steady. PULSES: no carotid bruit NEURO:  cn 2-12 grossly intact.   readily moves all 4's.   SKIN:  Normal texture and temperature.  No rash or suspicious lesion is visible.   NODES:  None palpable at the neck PSYCH: alert, oriented x3.  Does not appear anxious nor depressed.        Assessment & Plan:  Wellness visit today, with problems stable, except as noted.   SEPARATE EVALUATION FOLLOWS--EACH PROBLEM HERE IS NEW, NOT RESPONDING TO TREATMENT, OR POSES SIGNIFICANT RISK TO THE PATIENT'S HEALTH: HISTORY OF THE PRESENT ILLNESS: Pt states few weeks of moderate cramps, worst at the legs, and assoc numbness of the legs PAST MEDICAL HISTORY reviewed and up to date today REVIEW OF SYSTEMS: Denies syncope and weight change PHYSICAL EXAMINATION: Pulses: dorsalis pedis intact  bilat.   Feet: no deformity.  no ulcer on the feet.  feet are of normal color and temp.  no edema.  Neuro: sensation is intact to touch on the feet VITAL SIGNS:  See vs page GENERAL: no distress LAB/XRAY RESULTS: Lab Results  Component Value Date   WBC 7.3 10/23/2010  HGB 11.4* 10/23/2010   HCT 34.5* 10/23/2010   PLT 233 10/23/2010   GLUCOSE 99 12/25/2010   CHOL 117 12/25/2010   TRIG 137.0 12/25/2010   HDL 54.80 12/25/2010   LDLCALC 35 12/25/2010   ALT 40* 12/25/2010   AST 28 12/25/2010   NA 142 12/25/2010   K 3.4* 12/25/2010   CL 103 12/25/2010   CREATININE 0.6 12/25/2010   BUN 11 12/25/2010   CO2 29 12/25/2010   TSH 3.010 10/23/2010   TSH 3.010 10/23/2010   TSH 3.010 10/23/2010   HGBA1C 5.8 12/25/2010  IMPRESSION: Hypokalemia, needs increased rx Cramps, new.   Possibly due to hypokalemia. Anemia, mild but persistent PLAN: See instruction page We'll follow anemia

## 2010-12-25 NOTE — Patient Instructions (Addendum)
blood tests are being requested for you today.  please call [phone] to hear your test results.  You will be prompted to enter the 9-digit "MRN" number that appears at the top left of this page, followed by #.  Then you will hear the message.   please consider these measures for your health:  minimize alcohol.  do not use tobacco products.  have a colonoscopy at least every 10 years from age 67.  Women should have an annual mammogram from age 55.  keep firearms safely stored.  always use seat belts.  have working smoke alarms in your home.  see an eye doctor and dentist regularly.  never drive under the influence of alcohol or drugs (including prescription drugs).  those with fair skin should take precautions against the sun.   please let me know what your wishes would be, if artificial life support measures should become necessary.  it is critically important to prevent falling down (keep floor areas well-lit, dry, and free of loose objects.  If you have a cane, walker, or wheelchair, you should use it, even for short trips around the house.  Also, try not to rush).  Please return in 1 year.   i have sent a prescription to your pharmacy, for cramps.   (update: i left message on phone-tree:  Take kcl 10/d.  Go to lab in 2 weeks for repeat labs).

## 2011-01-04 ENCOUNTER — Encounter: Payer: Self-pay | Admitting: *Deleted

## 2011-01-04 ENCOUNTER — Encounter: Payer: Medicare Other | Admitting: Internal Medicine

## 2011-01-07 ENCOUNTER — Ambulatory Visit
Admission: RE | Admit: 2011-01-07 | Discharge: 2011-01-07 | Disposition: A | Discharge status: Home / Self Care | Payer: Medicare Other | Source: Ambulatory Visit | Attending: Radiation Oncology | Admitting: Radiation Oncology

## 2011-01-07 ENCOUNTER — Encounter: Payer: Self-pay | Admitting: Radiation Oncology

## 2011-01-07 VITALS — BP 111/69 | HR 74 | Temp 99.0°F | Resp 20 | Wt 176.9 lb

## 2011-01-07 DIAGNOSIS — C50919 Malignant neoplasm of unspecified site of unspecified female breast: Secondary | ICD-10-CM

## 2011-01-07 NOTE — Progress Notes (Signed)
CC:   Pierce Crane, M.D., F.R.C.P.C. [name] Howard-McNatt, MD  DIAGNOSIS:  Stage I right breast cancer.  INTERVAL SINCE RADIATION THERAPY:  1 year 1 month.  NARRATIVE:  [name] [name] comes in today for routine followup.  She has been on tamoxifen for approximately 3-4 months and overall is tolerating this medicine better than her other hormonal treatments.  The patient continues to have some muscle aches, however.  The patient did develop significant pruritus and erythema in the breast several weeks ago.  The patient took Benadryl which relieved this issue.  The patient does relate receiving iron injections prior to this episode.  The patient's strength is better since she received iron infusion under the direction of Dr. Donnie Coffin.  PHYSICAL EXAMINATION:  The patient's weight is 176, which is stable. Temperature 99, pulse 74, blood pressure 111/69.  Examination of the lungs reveals them to be clear.  The heart has regular rhythm and rate. There is no palpable cervical or supraclavicular adenopathy. Examination of the left breast reveals fibrocystic changes throughout. There is no dominant mass appreciated in the breast, nipple discharge or bleeding.  Examination of the right breast reveals some hyperpigmentation changes.  There is some induration at the lumpectomy site, but no dominant mass is appreciated.  There is no nipple discharge or bleeding noted.  IMPRESSION/PLAN:  Clinically NED.  In light of the patient's close followup with Dr. Donnie Coffin, I have not scheduled [name] [name] for formal followup appointment, but would be glad to see her at any time.    ______________________________ Billie Lade, Ph.D., M.D. JDK/MEDQ  D:  01/07/2011  T:  01/07/2011  Job:  [id number]

## 2011-01-07 NOTE — Progress Notes (Signed)
F/u right breast, no c/o pain, pt did have some itching 2-3 weeks ago 2-3days,took benadryl and resolved, last mammogram 04/02/10, due this March, on tamoxifen 20mg  daily 11:49 AM, pt iron  Injection last done 5 weeks ago Dr. Gita Kudo office, doing much better stated

## 2011-01-18 ENCOUNTER — Other Ambulatory Visit (INDEPENDENT_AMBULATORY_CARE_PROVIDER_SITE_OTHER): Payer: Medicare Other

## 2011-01-18 ENCOUNTER — Telehealth: Payer: Self-pay | Admitting: *Deleted

## 2011-01-18 DIAGNOSIS — E876 Hypokalemia: Secondary | ICD-10-CM

## 2011-01-18 DIAGNOSIS — K769 Liver disease, unspecified: Secondary | ICD-10-CM

## 2011-01-18 LAB — BASIC METABOLIC PANEL
BUN: 14 mg/dL (ref 6–23)
Chloride: 105 mEq/L (ref 96–112)
GFR: 83.06 mL/min (ref >60.00)
Glucose, Bld: 131 mg/dL — ABNORMAL HIGH (ref 70–99)
Potassium: 3.4 mEq/L — ABNORMAL LOW (ref 3.5–5.1)
Sodium: 140 mEq/L (ref 135–145)

## 2011-01-18 LAB — HEPATIC FUNCTION PANEL
ALT: 39 U/L — ABNORMAL HIGH (ref 0–35)
AST: 37 U/L (ref 0–37)
Albumin: 4 g/dL (ref 3.5–5.2)
Alkaline Phosphatase: 79 U/L (ref 39–117)

## 2011-01-18 MED ORDER — ESOMEPRAZOLE MAGNESIUM 40 MG PO CPDR: 40.0000 mg | DELAYED_RELEASE_CAPSULE | Freq: Every day | ORAL | Status: DC

## 2011-01-18 NOTE — Telephone Encounter (Signed)
Patient came to office to ask for samples of Nexium. States the Prilosec does not help her acid reflux like the Nexium did. Patient given samples of Nexium to try until next week.

## 2011-01-19 LAB — HEPATITIS C ANTIBODY: HCV Ab: NEGATIVE

## 2011-01-31 ENCOUNTER — Telehealth: Payer: Self-pay | Admitting: *Deleted

## 2011-01-31 NOTE — Telephone Encounter (Signed)
Kidneys are normal.  Potasium is 3.4--borderline low.  We could double the pill, or continue the same.  Up to you.

## 2011-01-31 NOTE — Telephone Encounter (Signed)
Pt informed of results-she states she will continue the same potassium dosage for now.

## 2011-01-31 NOTE — Telephone Encounter (Signed)
Pt called requesting results of most recent lab test for potassium and liver function.

## 2011-01-31 NOTE — Telephone Encounter (Signed)
Left message for pt to callback office.  

## 2011-02-04 ENCOUNTER — Other Ambulatory Visit: Payer: Self-pay | Admitting: Internal Medicine

## 2011-02-04 MED ORDER — ESOMEPRAZOLE MAGNESIUM 40 MG PO CPDR: 40.0000 mg | DELAYED_RELEASE_CAPSULE | Freq: Every day | ORAL | Status: DC

## 2011-02-04 NOTE — Telephone Encounter (Signed)
rx sent for Nexium

## 2011-02-13 ENCOUNTER — Telehealth: Payer: Self-pay

## 2011-02-13 NOTE — Telephone Encounter (Signed)
Received call from pt stating that she got a letter from the [facility] at [facility], where she had her surgery done, regarding time to schedule a mammogram, and she also got a letter from the [facility] on [address] regarding the same thing, and having a bone density test.  Pt states she would like to know what Dr. Donnie Coffin would like for her to do, as she is trying to coordinate appointments with plans to travel out of state to visit her son.  Informed her will speak with Debbora Presto, PA, as MD is not in the office this week, for clarification and office will call her back.  Her return number is [phone].

## 2011-02-13 NOTE — Telephone Encounter (Signed)
No note

## 2011-03-13 ENCOUNTER — Other Ambulatory Visit: Payer: Self-pay | Admitting: *Deleted

## 2011-03-13 DIAGNOSIS — E739 Lactose intolerance, unspecified: Secondary | ICD-10-CM

## 2011-03-13 DIAGNOSIS — E042 Nontoxic multinodular goiter: Secondary | ICD-10-CM

## 2011-03-13 DIAGNOSIS — C50919 Malignant neoplasm of unspecified site of unspecified female breast: Secondary | ICD-10-CM

## 2011-03-13 DIAGNOSIS — D126 Benign neoplasm of colon, unspecified: Secondary | ICD-10-CM

## 2011-03-13 DIAGNOSIS — E039 Hypothyroidism, unspecified: Secondary | ICD-10-CM

## 2011-03-19 ENCOUNTER — Telehealth: Payer: Self-pay | Admitting: *Deleted

## 2011-03-19 NOTE — Telephone Encounter (Signed)
Pt. Calls and for the last 10 days she has had some knots on her upper neck running into her scalp.  They are not painful, but they itch. She also feels like her neck is a little stiff.  She denies any temp or other sx.  She already has an appt to see her PCP on Thursday 2/28.  Discussed with Debbora Presto PA:  Probably not related to her tamoxifen.  Fine to see her PCP for this.  She can call and see if they want to see her any earlier.  She is fine with this and will call them back.

## 2011-03-20 ENCOUNTER — Ambulatory Visit: Payer: Medicare Other | Admitting: Internal Medicine

## 2011-03-21 ENCOUNTER — Other Ambulatory Visit: Payer: Self-pay | Admitting: Oncology

## 2011-03-21 ENCOUNTER — Ambulatory Visit: Payer: Medicare Other | Admitting: Endocrinology

## 2011-03-21 DIAGNOSIS — C50419 Malignant neoplasm of upper-outer quadrant of unspecified female breast: Secondary | ICD-10-CM

## 2011-03-25 ENCOUNTER — Encounter: Payer: Self-pay | Admitting: Internal Medicine

## 2011-03-25 ENCOUNTER — Ambulatory Visit
Admission: RE | Admit: 2011-03-25 | Discharge: 2011-03-25 | Disposition: A | Discharge status: Home / Self Care | Payer: Medicare Other | Source: Ambulatory Visit | Attending: Oncology | Admitting: Oncology

## 2011-03-25 ENCOUNTER — Ambulatory Visit (INDEPENDENT_AMBULATORY_CARE_PROVIDER_SITE_OTHER): Payer: Medicare Other | Admitting: Internal Medicine

## 2011-03-25 VITALS — BP 120/62 | HR 81 | Temp 98.2°F | Ht 62.5 in | Wt 179.2 lb

## 2011-03-25 DIAGNOSIS — L299 Pruritus, unspecified: Secondary | ICD-10-CM

## 2011-03-25 DIAGNOSIS — R42 Dizziness and giddiness: Secondary | ICD-10-CM | POA: Insufficient documentation

## 2011-03-25 DIAGNOSIS — Z9889 Other specified postprocedural states: Secondary | ICD-10-CM

## 2011-03-25 DIAGNOSIS — L659 Nonscarring hair loss, unspecified: Secondary | ICD-10-CM | POA: Insufficient documentation

## 2011-03-25 DIAGNOSIS — H9209 Otalgia, unspecified ear: Secondary | ICD-10-CM

## 2011-03-25 DIAGNOSIS — Z853 Personal history of malignant neoplasm of breast: Secondary | ICD-10-CM

## 2011-03-25 MED ORDER — TRIAMCINOLONE ACETONIDE 0.1 % EX CREA: TOPICAL_CREAM | Freq: Two times a day (BID) | CUTANEOUS | Status: AC

## 2011-03-25 MED ORDER — MECLIZINE HCL 12.5 MG PO TABS: 12.5000 mg | ORAL_TABLET | Freq: Three times a day (TID) | ORAL | Status: AC | PRN

## 2011-03-25 NOTE — Progress Notes (Signed)
Subjective:    Patient ID: [name] [name], female    DOB: 08-01-1945, 66 y.o.   MRN: [id number]  HPI  Here to f/u as Dr Everardo All not available; pt c/u intense itchiness and bumps to the scalp in the bilat occipital area, worse it seems to more scratching, in the setting of ongoing hair thinning and patchiness at the crown, all worse in the past 8 days, daughter nurse suggested it might be [place] related to a recent URI?  Pt with fatigue, but no fever and URI symtpoms resolved, except for ongoing bilat ear pain and tinnitus that just wont seem to go away, and asssoc with recurrent vertigo as well.  Pt denies chest pain, increased sob or doe, wheezing, orthopnea, PND, increased LE swelling, palpitations, dizziness or syncope.  Pt denies new neurological symptoms such as new headache, or facial or extremity weakness or numbness   Pt denies polydipsia, polyuria.  Pt denies fever, wt loss, night sweats, loss of appetite, or other constitutional symptoms Past Medical History  Diagnosis Date  . BREAST CANCER 07/04/2009  . HYPERTENSION 06/13/2007  . HYPERLIPIDEMIA 06/13/2007  . GLUCOSE INTOLERANCE 06/26/2007  . UNSPECIFIED OSTEOPOROSIS 11/18/2008  . OBSTRUCTIVE SLEEP APNEA 08/06/2007  . GOITER, MULTINODULAR 06/26/2007  . HYPOTHYROIDISM 06/13/2007  . ANXIETY 06/26/2007  . Unspecified Iron Deficiency Anemia 06/13/2007  . CARPAL TUNNEL SYNDROME, BILATERAL 06/13/2007  . ASTHMA 06/13/2007  . GERD 06/13/2007  . HIATAL HERNIA 06/13/2007  . Microscopic hematuria 01/03/2009  . OSTEOPENIA 06/26/2007  . HYPERSOMNIA 06/26/2007  . Myalgia and myositis, unspecified     Myofascial pain syndrome  . Depression   . Chronic cystitis   . Hx of adenomatous colonic polyps   . H. pylori infection 2008  . Fatty liver   . History of radiation therapy 10/24/09-12/08/09    right breast cancer  . History of chemotherapy     taxoterte/cytoxan 4 cycles completed 09/08/09  . Hiatal hernia   . Cataract     extraction   Past Surgical History    Procedure Date  . Abdominal hysterectomy   . Carpal tunnel release   . Urethral dilation     s/p  . Breast surgery     breast biopsy-neg x 2  . Cataract extraction   . Breast lumpectomy 04/2009    L breast   . Appendectomy   . Breast lumpectomy     right breast-cancerous    reports that she has never smoked. She has never used smokeless tobacco. She reports that she does not drink alcohol or use illicit drugs. family history includes Arthritis in her other; Asthma in her mother and son; Colon cancer (age of onset:82) in her father; Hypertension in her brother and sister; Prostate cancer in her other; Stomach cancer in her maternal grandfather; Thyroid disease in her other; and Ulcerative colitis in her son. Allergies  Allergen Reactions  . Penicillins    Current Outpatient Prescriptions on File Prior to Visit  Medication Sig Dispense Refill  . albuterol (PROVENTIL,VENTOLIN) 90 MCG/ACT inhaler Inhale 2 puffs into the lungs every 6 (six) hours as needed.        Marland Kitchen amitriptyline (ELAVIL) 50 MG tablet Take 1 tablet (50 mg total) by mouth at bedtime.  30 tablet  0  . Calcium Carbonate-Vitamin D (CALTRATE 600+D) 600-400 MG-UNIT per tablet Take 1 tablet by mouth daily.        . cholecalciferol (VITAMIN D) 1000 UNITS tablet Take 1,000 Units by mouth daily.        Marland Kitchen  esomeprazole (NEXIUM) 40 MG capsule Take 1 capsule (40 mg total) by mouth daily.  30 capsule  3  . furosemide (LASIX) 20 MG tablet        . gabapentin (NEURONTIN) 100 MG tablet 2 tablets by mouth three times a day       . ibuprofen (ADVIL,MOTRIN) 200 MG tablet As directed       . LEVOXYL 25 MCG tablet Take 1 tablet (25 mcg total) by mouth daily.  90 each  1  . Multiple Vitamins-Minerals (CENTRUM SILVER PO) Take 1 tablet by mouth daily.        . potassium chloride (K-DUR,KLOR-CON) 10 MEQ tablet Take 1 tablet (10 mEq total) by mouth 2 (two) times daily.  30 tablet  11  . pravastatin (PRAVACHOL) 80 MG tablet TAKE ONE TABLET BY MOUTH  AT BEDTIME  30 tablet  4  . SINGULAIR 10 MG tablet TAKE ONE TABLET BY MOUTH EVERY DAY AS NEEDED  30 each  3  . tamoxifen (NOLVADEX) 20 MG tablet       . vitamin B-12 (CYANOCOBALAMIN) 100 MCG tablet Take 50 mcg by mouth daily.         Review of Systems Review of Systems  Constitutional: Negative for diaphoresis and unexpected weight change.  HENT: Negative for drooling and tinnitus.   Eyes: Negative for photophobia and visual disturbance.  Respiratory: Negative for choking and stridor.   Gastrointestinal: Negative for vomiting and blood in stool.  Genitourinary: Negative for hematuria and decreased urine volume. .       Objective:   Physical Exam BP 120/62  Pulse 81  Temp(Src) 98.2 F (36.8 C) (Oral)  Ht 5' 2.5" (1.588 m)  Wt 179 lb 4 oz (81.307 kg)  BMI 32.26 kg/m2  SpO2 95% Physical Exam  VS noted, not ill appearing Constitutional: Pt appears well-developed and well-nourished.  HENT: Head: Normocephalic.  Right Ear: External ear normal.  Left Ear: External ear normal.  Bilat tm's mild erythema.  Sinus nontender.  Pharynx mild erythema Eyes: Conjunctivae and EOM are normal. Pupils are equal, round, and reactive to light.  Neck: Normal range of motion. Neck supple.  Cardiovascular: Normal rate and regular rhythm.   Pulmonary/Chest: Effort normal and breath sounds normal.  Neurological: Pt is alert. No cranial nerve deficit. motor/gait intact Skin: Skin is warm. No erythema. except for numerous small pruritic erythem rash spots to bilat occipital area at the hairline Psychiatric: Pt behavior is normal. Thought content normal. 1+ nervous    Assessment & Plan:

## 2011-03-25 NOTE — Assessment & Plan Note (Addendum)
Mild to mod, for mucinex otc prn  to f/u any worsening symptoms or concernsm, refer to ENT per pt reqeust

## 2011-03-25 NOTE — Assessment & Plan Note (Signed)
Mild to mod, for derm referral, ,  to f/u any worsening symptoms or concerns

## 2011-03-25 NOTE — Assessment & Plan Note (Addendum)
Mild to mod, for steroid cream prn trial course,  to f/u any worsening symptoms or concerns, refer derm for further eval and tx

## 2011-03-25 NOTE — Patient Instructions (Signed)
Take all new medications as prescribed Continue all other medications as before You will be contacted regarding the referral for: dermatology, and ENT

## 2011-03-25 NOTE — Assessment & Plan Note (Signed)
Mild to mod, for meclizine prn course,  to f/u any worsening symptoms or concerns

## 2011-03-26 ENCOUNTER — Telehealth: Payer: Self-pay | Admitting: Internal Medicine

## 2011-03-26 NOTE — Telephone Encounter (Signed)
Message copied by Janee Morn on Tue Mar 26, 2011  4:43 PM ------      Message from: Corwin Levins      Created: Tue Mar 26, 2011 12:37 PM       Ok to cancel referral,  I have nothing else to offer, except to f/u with Dr Everardo All      ----- Message -----         From: Janee Morn         Sent: 03/26/2011  12:35 PM           To: Oliver Barre, MD            [facility] has offered pt appt for thurs 04/07/11 pt refused appt .She was also offered a another appt that she also refused. They also said that pt refuses to use medication that you prescribed for her. Please advise.   Thanks

## 2011-03-28 ENCOUNTER — Other Ambulatory Visit (HOSPITAL_BASED_OUTPATIENT_CLINIC_OR_DEPARTMENT_OTHER): Payer: Medicare Other

## 2011-03-28 DIAGNOSIS — E039 Hypothyroidism, unspecified: Secondary | ICD-10-CM

## 2011-03-28 DIAGNOSIS — E042 Nontoxic multinodular goiter: Secondary | ICD-10-CM

## 2011-03-28 DIAGNOSIS — E739 Lactose intolerance, unspecified: Secondary | ICD-10-CM

## 2011-03-28 DIAGNOSIS — C50919 Malignant neoplasm of unspecified site of unspecified female breast: Secondary | ICD-10-CM

## 2011-03-28 DIAGNOSIS — D126 Benign neoplasm of colon, unspecified: Secondary | ICD-10-CM

## 2011-03-28 LAB — CBC WITH DIFFERENTIAL/PLATELET
Basophils Absolute: 0 10*3/uL (ref 0.0–0.1)
EOS%: 3.5 % (ref 0.0–7.0)
Eosinophils Absolute: 0.3 10*3/uL (ref 0.0–0.5)
HCT: 37 % (ref 34.8–46.6)
HGB: 12.6 g/dL (ref 11.6–15.9)
LYMPH%: 36.4 % (ref 14.0–49.7)
MCH: 33.6 pg (ref 25.1–34.0)
MCV: 98.5 fL (ref 79.5–101.0)
MONO%: 4 % (ref 0.0–14.0)
NEUT#: 4.2 10*3/uL (ref 1.5–6.5)
NEUT%: 55.5 % (ref 38.4–76.8)
Platelets: 212 10*3/uL (ref 145–400)
RDW: 13 % (ref 11.2–14.5)

## 2011-03-29 LAB — BASIC METABOLIC PANEL
BUN: 11 mg/dL (ref 6–23)
Creatinine, Ser: 0.72 mg/dL (ref 0.50–1.10)
Glucose, Bld: 126 mg/dL — ABNORMAL HIGH (ref 70–99)
Potassium: 3.5 mEq/L (ref 3.5–5.3)

## 2011-03-29 LAB — VITAMIN B12: Vitamin B-12: 592 pg/mL (ref 211–911)

## 2011-03-29 LAB — CANCER ANTIGEN 27.29: CA 27.29: 4 U/mL (ref 0–39)

## 2011-03-29 LAB — VITAMIN D 25 HYDROXY (VIT D DEFICIENCY, FRACTURES): Vit D, 25-Hydroxy: 37 ng/mL (ref 30–89)

## 2011-03-29 LAB — IRON AND TIBC: %SAT: 23 % (ref 20–55)

## 2011-04-04 ENCOUNTER — Ambulatory Visit (HOSPITAL_BASED_OUTPATIENT_CLINIC_OR_DEPARTMENT_OTHER): Payer: Medicare Other | Admitting: Oncology

## 2011-04-04 ENCOUNTER — Telehealth: Payer: Self-pay | Admitting: *Deleted

## 2011-04-04 VITALS — BP 118/64 | HR 79 | Temp 98.7°F | Ht 62.5 in | Wt 178.1 lb

## 2011-04-04 DIAGNOSIS — R5383 Other fatigue: Secondary | ICD-10-CM

## 2011-04-04 DIAGNOSIS — R5381 Other malaise: Secondary | ICD-10-CM

## 2011-04-04 DIAGNOSIS — R635 Abnormal weight gain: Secondary | ICD-10-CM

## 2011-04-04 DIAGNOSIS — G609 Hereditary and idiopathic neuropathy, unspecified: Secondary | ICD-10-CM

## 2011-04-04 DIAGNOSIS — C50919 Malignant neoplasm of unspecified site of unspecified female breast: Secondary | ICD-10-CM

## 2011-04-04 NOTE — Progress Notes (Signed)
Hematology and Oncology Follow Up Visit  [name] [name] [id number] 09-04-1945 66 y.o. 04/04/2011 11:09 PM PCP  Principle Diagnosis: :  History of T1c N0, ER, PR-positive, HER-2/neu-negative breast cancer, status post 4 of TC chemotherapy, last cycle completed 09/08/2009.  Status post radiation therapy completed 12/08/2009, previously  AI therapy, now tamoxifen   Interim History:  There have been no intercurrent illness, hospitalizations or medication changes.  Medications: I have reviewed the patient's current medications.  Allergies:  Allergies  Allergen Reactions  . Penicillins     Past Medical History, Surgical history, Social history, and Family History were reviewed and updated.  Review of Systems: Constitutional:  Negative for fever, chills, night sweats, anorexia, weight loss, pain. Cardiovascular: no chest pain or dyspnea on exertion Respiratory: no cough, shortness of breath, or wheezing Neurological: no TIA or stroke symptoms Dermatological: negative ENT: negative Skin Gastrointestinal: no abdominal pain, change in bowel habits, or black or bloody stools Genito-Urinary: no dysuria, trouble voiding, or hematuria Hematological and Lymphatic: negative Breast: negative for breast lumps Musculoskeletal: She continues to have symptoms of peripheral neuropathy especially evenings. Remaining ROS negative.  Physical Exam: Blood pressure 118/64, pulse 79, temperature 98.7 F (37.1 C), temperature source Oral, height 5' 2.5" (1.588 m), weight 178 lb 1.6 oz (80.786 kg). ECOG: 0 General appearance: alert, cooperative and appears stated age Head: Normocephalic, without obvious abnormality, atraumatic Neck: no adenopathy, no carotid bruit, no JVD, supple, symmetrical, trachea midline and thyroid not enlarged, symmetric, no tenderness/mass/nodules Lymph nodes: Cervical, supraclavicular, and axillary nodes normal. Cardiac : regular rate and rhythm, no murmurs or  gallops Pulmonary:clear to auscultation bilaterally and normal percussion bilaterally Breasts: inspection negative, no nipple discharge or bleeding, no masses or nodularity palpable Abdomen:soft, non-tender; bowel sounds normal; no masses,  no organomegaly Extremities negative Neuro: alert, oriented, normal speech, no focal findings or movement disorder noted  Lab Results: Lab Results  Component Value Date   WBC 7.7 03/28/2011   HGB 12.6 03/28/2011   HCT 37.0 03/28/2011   MCV 98.5 03/28/2011   PLT 212 03/28/2011     Chemistry      Component Value Date/Time   NA 141 03/28/2011 1305   K 3.5 03/28/2011 1305   CL 105 03/28/2011 1305   CO2 26 03/28/2011 1305   BUN 11 03/28/2011 1305   CREATININE 0.72 03/28/2011 1305      Component Value Date/Time   CALCIUM 9.2 03/28/2011 1305   CALCIUM 9.3 12/25/2010 1428   ALKPHOS 79 01/18/2011 1339   AST 37 01/18/2011 1339   ALT 39* 01/18/2011 1339   BILITOT 0.4 01/18/2011 1339      .pathology. Radiological Studies: chest X-ray n/a Mammogram 3/13 Bone density n/a  Impression and Plan: Pleasant 30 -year-old woman with history of breast cancer status post chemotherapy and radiation. He continues to have problems of fatigue and peripheral neuropathy all of which are leading  to weight gain. I have recommended that her thyroid function be tested. Iron stores have been tested and are are normal. I recommended that she get her CPAP machine adjusted as well. I've also recommended that she increase her gabapentin dose to 4 or 500 mg in the evenings. I will see her in 6 months time More than 50% of the visit was spent in patient-related counselling   Pierce Crane, MD 3/14/201311:09 PM

## 2011-04-04 NOTE — Telephone Encounter (Signed)
per crystal gave patient appointment for [phone]

## 2011-05-06 ENCOUNTER — Other Ambulatory Visit (INDEPENDENT_AMBULATORY_CARE_PROVIDER_SITE_OTHER): Payer: Medicare Other

## 2011-05-06 ENCOUNTER — Encounter: Payer: Self-pay | Admitting: Endocrinology

## 2011-05-06 ENCOUNTER — Ambulatory Visit (INDEPENDENT_AMBULATORY_CARE_PROVIDER_SITE_OTHER): Payer: Medicare Other | Admitting: Endocrinology

## 2011-05-06 VITALS — BP 114/68 | HR 74 | Temp 98.6°F | Wt 176.0 lb

## 2011-05-06 DIAGNOSIS — L299 Pruritus, unspecified: Secondary | ICD-10-CM

## 2011-05-06 LAB — TSH: TSH: 2.44 u[IU]/mL (ref 0.35–5.50)

## 2011-05-06 MED ORDER — KLOR-CON 10 10 MEQ PO TBCR: 10.0000 meq | EXTENDED_RELEASE_TABLET | Freq: Two times a day (BID) | ORAL | Status: DC

## 2011-05-06 NOTE — Patient Instructions (Signed)
i have sent a prescription to your pharmacy, for a brand-name potassium pill. i would be happy to refer you to an ear-nose-throat specialist if you wish.

## 2011-05-06 NOTE — Progress Notes (Signed)
Subjective:    Patient ID: [name] [name], female    DOB: [birth year], 66 y.o.   MRN: [id number]  HPI Pt states few mos of slight diffuse rash, and assoc itching.  She says this may be due to use of generic KLOR, rather than brand-name.  She saw dermatology, who could not find any other cause.  Pt says few mos of dizziness, in the context of loud noise  Past Medical History  Diagnosis Date  . BREAST CANCER 07/04/2009  . HYPERTENSION 06/13/2007  . HYPERLIPIDEMIA 06/13/2007  . GLUCOSE INTOLERANCE 06/26/2007  . UNSPECIFIED OSTEOPOROSIS 11/18/2008  . OBSTRUCTIVE SLEEP APNEA 08/06/2007  . GOITER, MULTINODULAR 06/26/2007  . HYPOTHYROIDISM 06/13/2007  . ANXIETY 06/26/2007  . Unspecified Iron Deficiency Anemia 06/13/2007  . CARPAL TUNNEL SYNDROME, BILATERAL 06/13/2007  . ASTHMA 06/13/2007  . GERD 06/13/2007  . HIATAL HERNIA 06/13/2007  . Microscopic hematuria 01/03/2009  . OSTEOPENIA 06/26/2007  . HYPERSOMNIA 06/26/2007  . Myalgia and myositis, unspecified     Myofascial pain syndrome  . Depression   . Chronic cystitis   . Hx of adenomatous colonic polyps   . H. pylori infection 2008  . Fatty liver   . History of radiation therapy 10/24/09-12/08/09    right breast cancer  . History of chemotherapy     taxoterte/cytoxan 4 cycles completed 09/08/09  . Hiatal hernia   . Cataract     extraction    Past Surgical History  Procedure Date  . Abdominal hysterectomy   . Carpal tunnel release   . Urethral dilation     s/p  . Breast surgery     breast biopsy-neg x 2  . Cataract extraction   . Breast lumpectomy 04/2009    L breast   . Appendectomy   . Breast lumpectomy     right breast-cancerous    History   Social History  . Marital Status: Married    Spouse Name: N/A    Number of Children: 3  . Years of Education: N/A   Occupational History  . Retired    Social History Main Topics  . Smoking status: Never Smoker   . Smokeless tobacco: Never Used  . Alcohol Use: No  . Drug Use: No  .  Sexually Active: Not on file   Other Topics Concern  . Not on file   Social History Narrative   Retired Teaching laboratory technician    Current Outpatient Prescriptions on File Prior to Visit  Medication Sig Dispense Refill  . albuterol (PROVENTIL,VENTOLIN) 90 MCG/ACT inhaler Inhale 2 puffs into the lungs every 6 (six) hours as needed.        . Calcium Carbonate-Vitamin D (CALTRATE 600+D) 600-400 MG-UNIT per tablet Take 1 tablet by mouth daily.        . cholecalciferol (VITAMIN D) 1000 UNITS tablet Take 1,000 Units by mouth daily.        Marland Kitchen esomeprazole (NEXIUM) 40 MG capsule Take 1 capsule (40 mg total) by mouth daily.  30 capsule  3  . furosemide (LASIX) 20 MG tablet        . gabapentin (NEURONTIN) 100 MG tablet 2 tablets by mouth three times a day       . ibuprofen (ADVIL,MOTRIN) 200 MG tablet As directed       . LEVOXYL 25 MCG tablet Take 1 tablet (25 mcg total) by mouth daily.  90 each  1  . LORazepam (ATIVAN) 0.5 MG tablet Take 0.5 mg by mouth every 8 (eight) hours.      Marland Kitchen  Multiple Vitamins-Minerals (CENTRUM SILVER PO) Take 1 tablet by mouth daily.        . pravastatin (PRAVACHOL) 80 MG tablet TAKE ONE TABLET BY MOUTH AT BEDTIME  30 tablet  4  . SINGULAIR 10 MG tablet TAKE ONE TABLET BY MOUTH EVERY DAY AS NEEDED  30 each  3  . tamoxifen (NOLVADEX) 20 MG tablet       . triamcinolone cream (KENALOG) 0.1 % Apply topically 2 (two) times daily.  30 g  0  . vitamin B-12 (CYANOCOBALAMIN) 100 MCG tablet Take 50 mcg by mouth daily.        Marland Kitchen KLOR-CON 10 10 MEQ tablet Take 1 tablet (10 mEq total) by mouth 2 (two) times daily.  60 tablet  11    Allergies  Allergen Reactions  . Penicillins     Family History  Problem Relation Age of Onset  . Asthma Mother   . Colon cancer Father 31  . Hypertension Sister   . Hypertension Brother   . Ulcerative colitis Son   . Asthma Son   . Stomach cancer Maternal Grandfather   . Thyroid disease Other     Aunt  . Arthritis Other   . Prostate cancer  Other     BP 114/68  Pulse 74  Temp(Src) 98.6 F (37 C) (Oral)  Wt 176 lb (79.833 kg)  SpO2 96%  Review of Systems Denies fever.  She has a slight earache.    Objective:   Physical Exam VITAL SIGNS:  See vs page GENERAL: no distress Both eac's and tm's are normal Skin:  Very mild diffuse red rash  Lab Results  Component Value Date   WBC 7.7 03/28/2011   HGB 12.6 03/28/2011   HCT 37.0 03/28/2011   PLT 212 03/28/2011   GLUCOSE 126* 03/28/2011   CHOL 117 12/25/2010   TRIG 137.0 12/25/2010   HDL 54.80 12/25/2010   LDLCALC 35 12/25/2010   ALT 39* 01/18/2011   AST 37 01/18/2011   NA 141 03/28/2011   K 3.5 03/28/2011   CL 105 03/28/2011   CREATININE 0.72 03/28/2011   BUN 11 03/28/2011   CO2 26 03/28/2011   TSH 2.44 05/06/2011   HGBA1C 5.8 12/25/2010      Assessment & Plan:  Rash.  Pt reports due to Klor Dizziness, uncertain etiology Hypokalemia.  She is on a good dosage of potassium

## 2011-05-07 ENCOUNTER — Telehealth: Payer: Self-pay | Admitting: *Deleted

## 2011-05-07 NOTE — Telephone Encounter (Signed)
Called pt to inform of TSH results, pt informed of results (Letter also mailed to pt).

## 2011-05-27 ENCOUNTER — Other Ambulatory Visit: Payer: Self-pay | Admitting: Oncology

## 2011-05-27 ENCOUNTER — Other Ambulatory Visit: Payer: Self-pay | Admitting: Pulmonary Disease

## 2011-05-27 ENCOUNTER — Other Ambulatory Visit: Payer: Self-pay | Admitting: Endocrinology

## 2011-05-27 DIAGNOSIS — C50919 Malignant neoplasm of unspecified site of unspecified female breast: Secondary | ICD-10-CM

## 2011-06-03 ENCOUNTER — Telehealth: Payer: Self-pay | Admitting: *Deleted

## 2011-06-03 MED ORDER — LEVOTHYROXINE SODIUM 25 MCG PO TABS: 25.0000 ug | ORAL_TABLET | Freq: Every day | ORAL | Status: DC

## 2011-06-03 NOTE — Telephone Encounter (Signed)
Left VM informing pt  rx sent to her pharmacy.

## 2011-06-03 NOTE — Telephone Encounter (Signed)
R'cd fax from [facility] to change Levoxyl to Synthroid or Levothyroxine because Levoyxl is on backorder

## 2011-06-03 NOTE — Telephone Encounter (Signed)
i sent rx 

## 2011-06-03 NOTE — Telephone Encounter (Signed)
R'cd phone call from [name] [name]-[name] [name] wants rx for brand name Synthroid.

## 2011-06-11 ENCOUNTER — Encounter: Payer: Self-pay | Admitting: Internal Medicine

## 2011-06-12 ENCOUNTER — Other Ambulatory Visit: Payer: Self-pay | Admitting: Endocrinology

## 2011-06-13 ENCOUNTER — Telehealth: Payer: Self-pay | Admitting: Endocrinology

## 2011-06-13 NOTE — Telephone Encounter (Signed)
ok 

## 2011-06-13 NOTE — Telephone Encounter (Signed)
Pt desire all her meds be sent in for a year---[phone]---please call pt

## 2011-06-13 NOTE — Telephone Encounter (Signed)
Pt informed all medications that Dr. Everardo All prescribed have been sent in for 1 year-pt wanted to speak with office manager-pt transferred to [facility].

## 2011-06-28 ENCOUNTER — Encounter: Payer: Self-pay | Admitting: Internal Medicine

## 2011-07-29 ENCOUNTER — Telehealth: Payer: Self-pay

## 2011-07-29 NOTE — Telephone Encounter (Signed)
Received message from pt stating that she has been "getting tired again," and wants to know does Dr. Donnie Coffin want to check and see if her iron is low again.  Called and spoke with pt's husband, to obtain any further symptoms, and he states pt is not there right now, but he will have pt call office back.  Informed him will check with providers.

## 2011-07-30 ENCOUNTER — Other Ambulatory Visit: Payer: Self-pay | Admitting: *Deleted

## 2011-07-30 DIAGNOSIS — E042 Nontoxic multinodular goiter: Secondary | ICD-10-CM

## 2011-07-30 DIAGNOSIS — D126 Benign neoplasm of colon, unspecified: Secondary | ICD-10-CM

## 2011-07-30 DIAGNOSIS — C50919 Malignant neoplasm of unspecified site of unspecified female breast: Secondary | ICD-10-CM

## 2011-07-31 ENCOUNTER — Encounter: Payer: Self-pay | Admitting: Gastroenterology

## 2011-08-01 ENCOUNTER — Other Ambulatory Visit: Payer: Medicare Other | Admitting: Lab

## 2011-08-02 ENCOUNTER — Other Ambulatory Visit (HOSPITAL_BASED_OUTPATIENT_CLINIC_OR_DEPARTMENT_OTHER): Payer: Medicare Other | Admitting: Lab

## 2011-08-02 DIAGNOSIS — E042 Nontoxic multinodular goiter: Secondary | ICD-10-CM

## 2011-08-02 DIAGNOSIS — C50419 Malignant neoplasm of upper-outer quadrant of unspecified female breast: Secondary | ICD-10-CM

## 2011-08-02 DIAGNOSIS — D126 Benign neoplasm of colon, unspecified: Secondary | ICD-10-CM

## 2011-08-02 DIAGNOSIS — C50919 Malignant neoplasm of unspecified site of unspecified female breast: Secondary | ICD-10-CM

## 2011-08-02 LAB — IRON AND TIBC
TIBC: 288 ug/dL (ref 250–470)
UIBC: 219 ug/dL (ref 125–400)

## 2011-08-02 LAB — CBC WITH DIFFERENTIAL/PLATELET
BASO%: 0.6 % (ref 0.0–2.0)
EOS%: 2.5 % (ref 0.0–7.0)
HGB: 12.6 g/dL (ref 11.6–15.9)
MCH: 32.8 pg (ref 25.1–34.0)
MCHC: 33.2 g/dL (ref 31.5–36.0)
MONO#: 0.5 10*3/uL (ref 0.1–0.9)
RDW: 13.5 % (ref 11.2–14.5)
WBC: 7.4 10*3/uL (ref 3.9–10.3)
lymph#: 2.5 10*3/uL (ref 0.9–3.3)

## 2011-08-05 ENCOUNTER — Telehealth: Payer: Self-pay | Admitting: Endocrinology

## 2011-08-05 NOTE — Telephone Encounter (Signed)
Caller: [name] [name]/Patient; PCP: Romero Belling; CB#: [phone]; ; ; Call regarding Received A Call 08/03/11 About an Appt?    EPIC chart shows 07/29/11 pt had called C/O "feeling tired again" and does Dr. want to see her?  Office called and husband answered and pt not available.  Pt calling back now about appt need.  Please call to advise.  Please call after 1500.

## 2011-08-06 ENCOUNTER — Encounter: Payer: Self-pay | Admitting: *Deleted

## 2011-08-06 ENCOUNTER — Telehealth: Payer: Self-pay | Admitting: *Deleted

## 2011-08-06 NOTE — Telephone Encounter (Signed)
left voice to inform the patient of the new date and time on 08-2011

## 2011-08-08 ENCOUNTER — Telehealth: Payer: Self-pay | Admitting: Endocrinology

## 2011-08-08 NOTE — Telephone Encounter (Signed)
Caller: Pamella/Patient; Phone Number: [phone]; Message from caller:2nd call: Reports someone called from office and left message to set up appt; wants to know who called and why?  Is it for her husband?  Per Epic, noted 08/06/11 call from oncologist advising appt for Aug 2013. No recent calls under husband's name.  Office: please call back about who called r/t appt with dr Everardo All and why?

## 2011-08-08 NOTE — Telephone Encounter (Signed)
Pt advised that call was from Oncology in response to pt's initial cal on 07/08. Pt understood and will contact that office.

## 2011-08-12 ENCOUNTER — Telehealth: Payer: Self-pay | Admitting: Oncology

## 2011-08-12 NOTE — Telephone Encounter (Signed)
Pt called to confirmed appt for 8/28 and requested lb be moved to 8/22. Pt given new d/t for lb 8/22 @ 1pm and PR 8/28 @ 12 pm.

## 2011-08-22 ENCOUNTER — Telehealth: Payer: Self-pay | Admitting: *Deleted

## 2011-08-22 ENCOUNTER — Ambulatory Visit (AMBULATORY_SURGERY_CENTER): Payer: Medicare Other | Admitting: *Deleted

## 2011-08-22 VITALS — Ht 62.0 in | Wt 180.0 lb

## 2011-08-22 DIAGNOSIS — Z1211 Encounter for screening for malignant neoplasm of colon: Secondary | ICD-10-CM

## 2011-08-22 DIAGNOSIS — D509 Iron deficiency anemia, unspecified: Secondary | ICD-10-CM

## 2011-08-22 MED ORDER — MOVIPREP 100 G PO SOLR: ORAL | Status: DC

## 2011-08-22 NOTE — Telephone Encounter (Signed)
Ms. Armbrister is scheduled for a colon at 8:30am on Wednesday 09/04/11.  She said Dr. Juanda Chance wanted her to have Endo also.  On looking back at Dr. Regino Schultze office visit of 11/27/2010 the second time I saw the note for pt to have both Endo and Colon. I explained to Ms and Mr. Boling that she would need another 30 minutes for the Endo to be added to the colon procedure.  I spoke with Karen Kitchens and she is going to ask Dr. Juanda Chance about adding on an EGD that morning 09/04/11. I explained this to Mr. And Ms. Fettes and she was not happy that we might have to change the day of her procedure.  Wyona Almas

## 2011-08-22 NOTE — Progress Notes (Signed)
[name] [name] is scheduled for a colon at 8:30am on Wednesday 09/04/11.  She said Dr. Brodie wanted her to have Endo also.  On looking back at Dr. Brodie's office visit of 11/27/2010 the second time I saw the note for pt to have both Endo and Colon. I explained to Ms and [name] [name] that she would need another 30 minutes for the Endo to be added to the colon procedure.  I spoke with Dottie and she is going to ask Dr. Brodie about adding on an EGD that morning 09/04/11. I explained this to [name] [name] and she was not happy that we might have to change the day of her procedure.  [name] [name]  

## 2011-08-23 ENCOUNTER — Encounter: Payer: Self-pay | Admitting: Internal Medicine

## 2011-08-23 NOTE — Telephone Encounter (Signed)
Patient advised that we will complete endoscopy/colonoscopy on 09/04/11 as previously planned. Patient verbalizes understanding.

## 2011-09-04 ENCOUNTER — Encounter: Payer: Self-pay | Admitting: Internal Medicine

## 2011-09-04 ENCOUNTER — Ambulatory Visit (AMBULATORY_SURGERY_CENTER): Payer: Medicare Other | Admitting: Internal Medicine

## 2011-09-04 VITALS — BP 155/82 | HR 87 | Temp 98.5°F | Resp 14 | Ht 62.0 in | Wt 180.0 lb

## 2011-09-04 DIAGNOSIS — Z1211 Encounter for screening for malignant neoplasm of colon: Secondary | ICD-10-CM

## 2011-09-04 DIAGNOSIS — D126 Benign neoplasm of colon, unspecified: Secondary | ICD-10-CM

## 2011-09-04 DIAGNOSIS — K2270 Barrett's esophagus without dysplasia: Secondary | ICD-10-CM

## 2011-09-04 DIAGNOSIS — K219 Gastro-esophageal reflux disease without esophagitis: Secondary | ICD-10-CM

## 2011-09-04 MED ORDER — SODIUM CHLORIDE 0.9 % IV SOLN: 500.0000 mL | INTRAVENOUS | Status: DC

## 2011-09-04 NOTE — Op Note (Signed)
[facility] [address] [place], [place]  [address]  COLONOSCOPY PROCEDURE REPORT  PATIENT:  [name] [name], [name] [name]  MR#:  [id number] BIRTHDATE:  December 31, 1945, 66 yrs. old  GENDER:  female ENDOSCOPIST:  Hedwig Morton. Juanda Chance, MD REF. BY: PROCEDURE DATE:  09/04/2011 PROCEDURE:  Colon with cold biopsy polypectomy ASA CLASS:  Class II INDICATIONS:  history of pre-cancerous (adenomatous) colon polyps 2 adenomatous polyps in 2008 MEDICATIONS:   MAC sedation, administered by CRNA, propofol (Diprivan) 80 mg  DESCRIPTION OF PROCEDURE:   After the risks and benefits and of the procedure were explained, informed consent was obtained. Digital rectal exam was performed and revealed no rectal masses. The LB [id number] endoscope was introduced through the anus and advanced to the cecum, which was identified by both the appendix and ileocecal valve.  The quality of the prep was good, using MoviPrep.  The instrument was then slowly withdrawn as the colon was fully examined. <<PROCEDUREIMAGES>>  FINDINGS:  Two polyps were found. in the right colon fusiform polyp vs thickened fold at 80 cm 3 mm sessile polyp The polyps were removed using cold biopsy forceps (see image4, image5, and image6).  Mild diverticulosis was found in the sigmoid colon (see image7 and image1).  This was otherwise a normal examination of the colon (see image2, image3, and image8).   Retroflexed views in the rectum revealed no abnormalities.    The scope was then withdrawn from the patient and the procedure completed.  COMPLICATIONS:  None ENDOSCOPIC IMPRESSION: 1) Two polyps 2) Mild diverticulosis in the sigmoid colon 3) Otherwise normal examination RECOMMENDATIONS: 1) Await pathology results 2) High fiber diet.  REPEAT EXAM:  In 5 year(s) for.  ______________________________ Hedwig Morton. Juanda Chance, MD  CC:  n. eSIGNED:   Hedwig Morton. Addisyn Leclaire at 09/04/2011 09:20 AM  Nelly Laurence, [phone]

## 2011-09-04 NOTE — Progress Notes (Signed)
Patient did not experience any of the following events: a burn prior to discharge; a fall within the facility; wrong site/side/patient/procedure/implant event; or a hospital transfer or hospital admission upon discharge from the facility. (G8907) Patient did not have preoperative order for IV antibiotic SSI prophylaxis. (G8918)  

## 2011-09-04 NOTE — Op Note (Signed)
[facility] [address] [place], [place]  [address]  ENDOSCOPY PROCEDURE REPORT  PATIENT:  [name] [name], [name] [name]  MR#:  [id number] BIRTHDATE:  08-Jun-1945, 66 yrs. old  GENDER:  female  ENDOSCOPIST:  Hedwig Morton. Juanda Chance, MD Referred by:  PROCEDURE DATE:  09/04/2011 PROCEDURE:  EGD with biopsy, 213[address] ASA CLASS:  Class II INDICATIONS:  GERD last EGD 2008- esophagitis, positive CLO test, treated  MEDICATIONS:   MAC sedation, administered by CRNA, propofol (Diprivan) 120 mg, glycopyrrolate (Robinal) 0.2 mg TOPICAL ANESTHETIC:  Cetacaine Spray  DESCRIPTION OF PROCEDURE:   After the risks benefits and alternatives of the procedure were thoroughly explained, informed consent was obtained.  The LB GIF-H180 [id number] endoscope was introduced through the mouth and advanced to the second portion of the duodenum, without limitations.  The instrument was slowly withdrawn as the mucosa was fully examined. <<PROCEDUREIMAGES>>  A hiatal hernia was found. 1-2 cm small hiatal hernia With standard forceps, a biopsy was obtained and sent to pathology (see image2 and image5).  Otherwise the examination was normal (see image4, image3, and image1).    Retroflexed views revealed no abnormalities.    The scope was then withdrawn from the patient and the procedure completed.  COMPLICATIONS:  None  ENDOSCOPIC IMPRESSION: 1) Hiatal hernia 2) Otherwise normal examination s/p biopsies from g-e junction RECOMMENDATIONS: 1) Await biopsy results 2) Anti-reflux regimen to be follow continue meds  REPEAT EXAM:  In 0 year(s) for.  ______________________________ Hedwig Morton. Juanda Chance, MD  CC:  n. eSIGNED:   Hedwig Morton. Renaldo Gornick at 09/04/2011 09:14 AM  Nelly Laurence, [phone]

## 2011-09-04 NOTE — Patient Instructions (Addendum)
Findings:  Polyp, Mild Diverticulosis, Hiatal Hernia Recommendations:  High Fiber Diet, Repeat colonoscopy in 5 years, Anti-reflux regimen, continue meds  YOU HAD AN ENDOSCOPIC PROCEDURE TODAY AT THE [facility]: Refer to the procedure report that was given to you for any specific questions about what was found during the examination.  If the procedure report does not answer your questions, please call your gastroenterologist to clarify.  If you requested that your care partner not be given the details of your procedure findings, then the procedure report has been included in a sealed envelope for you to review at your convenience later.  YOU SHOULD EXPECT: Some feelings of bloating in the abdomen. Passage of more gas than usual.  Walking can help get rid of the air that was put into your GI tract during the procedure and reduce the bloating. If you had a lower endoscopy (such as a colonoscopy or flexible sigmoidoscopy) you may notice spotting of blood in your stool or on the toilet paper. If you underwent a bowel prep for your procedure, then you may not have a normal bowel movement for a few days.  DIET: Your first meal following the procedure should be a light meal and then it is ok to progress to your normal diet.  A half-sandwich or bowl of soup is an example of a good first meal.  Heavy or fried foods are harder to digest and may make you feel nauseous or bloated.  Likewise meals heavy in dairy and vegetables can cause extra gas to form and this can also increase the bloating.  Drink plenty of fluids but you should avoid alcoholic beverages for 24 hours.  ACTIVITY: Your care partner should take you home directly after the procedure.  You should plan to take it easy, moving slowly for the rest of the day.  You can resume normal activity the day after the procedure however you should NOT DRIVE or use heavy machinery for 24 hours (because of the sedation medicines used during the test).     SYMPTOMS TO REPORT IMMEDIATELY: A gastroenterologist can be reached at any hour.  During normal business hours, 8:30 AM to 5:00 PM Monday through Friday, call [phone].  After hours and on weekends, please call the GI answering service at [phone] who will take a message and have the physician on call contact you.   Following lower endoscopy (colonoscopy or flexible sigmoidoscopy):  Excessive amounts of blood in the stool  Significant tenderness or worsening of abdominal pains  Swelling of the abdomen that is new, acute  Fever of 100F or higher  Following upper endoscopy (EGD)  Vomiting of blood or coffee ground material  New chest pain or pain under the shoulder blades  Painful or persistently difficult swallowing  New shortness of breath  Fever of 100F or higher  Black, tarry-looking stools  FOLLOW UP: If any biopsies were taken you will be contacted by phone or by letter within the next 1-3 weeks.  Call your gastroenterologist if you have not heard about the biopsies in 3 weeks.  Our staff will call the home number listed on your records the next business day following your procedure to check on you and address any questions or concerns that you may have at that time regarding the information given to you following your procedure. This is a courtesy call and so if there is no answer at the home number and we have not heard from you through the emergency physician  on call, we will assume that you have returned to your regular daily activities without incident.  SIGNATURES/CONFIDENTIALITY: You and/or your care partner have signed paperwork which will be entered into your electronic medical record.  These signatures attest to the fact that that the information above on your After Visit Summary has been reviewed and is understood.  Full responsibility of the confidentiality of this discharge information lies with you and/or your care-partner.   Please follow all discharge  instructions given to you by the recovery room nurse. If you have any questions or problems after discharge please call one of the numbers listed above. You will receive a phone call in the am to see how you are doing and answer any questions you may have. Thank you for choosing  [facility] for [facility] care needs.

## 2011-09-05 ENCOUNTER — Telehealth: Payer: Self-pay | Admitting: *Deleted

## 2011-09-05 NOTE — Telephone Encounter (Signed)
  Follow up Call-  Call back number 09/04/2011  Post procedure Call Back phone  # [phone]  Permission to leave phone message Yes     Patient questions:  Left message to call us if necessary.

## 2011-09-10 ENCOUNTER — Encounter: Payer: Self-pay | Admitting: Internal Medicine

## 2011-09-12 ENCOUNTER — Other Ambulatory Visit: Payer: Medicare Other | Admitting: Lab

## 2011-09-18 ENCOUNTER — Ambulatory Visit: Payer: Medicare Other | Admitting: Oncology

## 2011-09-18 ENCOUNTER — Other Ambulatory Visit: Payer: Medicare Other | Admitting: Lab

## 2011-10-01 ENCOUNTER — Other Ambulatory Visit: Payer: Self-pay | Admitting: *Deleted

## 2011-10-01 DIAGNOSIS — C50919 Malignant neoplasm of unspecified site of unspecified female breast: Secondary | ICD-10-CM

## 2011-10-01 DIAGNOSIS — D509 Iron deficiency anemia, unspecified: Secondary | ICD-10-CM

## 2011-10-02 ENCOUNTER — Other Ambulatory Visit (HOSPITAL_BASED_OUTPATIENT_CLINIC_OR_DEPARTMENT_OTHER): Payer: Medicare Other | Admitting: Lab

## 2011-10-02 DIAGNOSIS — D509 Iron deficiency anemia, unspecified: Secondary | ICD-10-CM

## 2011-10-02 DIAGNOSIS — E559 Vitamin D deficiency, unspecified: Secondary | ICD-10-CM

## 2011-10-02 DIAGNOSIS — C50419 Malignant neoplasm of upper-outer quadrant of unspecified female breast: Secondary | ICD-10-CM

## 2011-10-02 DIAGNOSIS — C50919 Malignant neoplasm of unspecified site of unspecified female breast: Secondary | ICD-10-CM

## 2011-10-02 LAB — CBC WITH DIFFERENTIAL/PLATELET
Eosinophils Absolute: 0.3 10*3/uL (ref 0.0–0.5)
HGB: 12.9 g/dL (ref 11.6–15.9)
MONO#: 0.7 10*3/uL (ref 0.1–0.9)
NEUT#: 5.7 10*3/uL (ref 1.5–6.5)
Platelets: 198 10*3/uL (ref 145–400)
RBC: 3.84 10*6/uL (ref 3.70–5.45)
RDW: 13.5 % (ref 11.2–14.5)
WBC: 8.8 10*3/uL (ref 3.9–10.3)

## 2011-10-02 LAB — COMPREHENSIVE METABOLIC PANEL (CC13)
ALT: 45 U/L (ref 0–55)
AST: 35 U/L — ABNORMAL HIGH (ref 5–34)
Albumin: 3.7 g/dL (ref 3.5–5.0)
Calcium: 9.2 mg/dL (ref 8.4–10.4)
Chloride: 106 mEq/L (ref 98–107)
Potassium: 3.8 mEq/L (ref 3.5–5.1)
Total Protein: 6.5 g/dL (ref 6.4–8.3)

## 2011-10-03 LAB — VITAMIN D 25 HYDROXY (VIT D DEFICIENCY, FRACTURES): Vit D, 25-Hydroxy: 30 ng/mL (ref 30–89)

## 2011-10-03 LAB — FERRITIN: Ferritin: 329 ng/mL — ABNORMAL HIGH (ref 10–291)

## 2011-10-08 ENCOUNTER — Other Ambulatory Visit: Payer: Medicare Other | Admitting: Lab

## 2011-10-08 ENCOUNTER — Ambulatory Visit: Payer: Medicare Other | Admitting: Oncology

## 2011-10-09 ENCOUNTER — Telehealth: Payer: Self-pay | Admitting: *Deleted

## 2011-10-09 ENCOUNTER — Ambulatory Visit (HOSPITAL_BASED_OUTPATIENT_CLINIC_OR_DEPARTMENT_OTHER): Payer: Medicare Other | Admitting: Oncology

## 2011-10-09 VITALS — BP 156/83 | HR 85 | Temp 99.1°F | Resp 20 | Ht 62.0 in | Wt 175.8 lb

## 2011-10-09 DIAGNOSIS — C50919 Malignant neoplasm of unspecified site of unspecified female breast: Secondary | ICD-10-CM

## 2011-10-09 DIAGNOSIS — M549 Dorsalgia, unspecified: Secondary | ICD-10-CM

## 2011-10-09 DIAGNOSIS — Z170 Estrogen receptor positive status [ER+]: Secondary | ICD-10-CM

## 2011-10-09 DIAGNOSIS — R21 Rash and other nonspecific skin eruption: Secondary | ICD-10-CM

## 2011-10-09 DIAGNOSIS — C50419 Malignant neoplasm of upper-outer quadrant of unspecified female breast: Secondary | ICD-10-CM

## 2011-10-09 NOTE — Telephone Encounter (Signed)
na

## 2011-10-09 NOTE — Progress Notes (Signed)
Hematology and Oncology Follow Up Visit  [name] [name] [id number] 02/27/1945 66 y.o. 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  Status post radiation therapy completed 12/08/2009, previously  AI therapy, now tamoxifen   Interim History:  She continues to complain of fatigue, she's also been having rashes interrupted on her face arms and chest. She's been to dermatology. She's been taken number of creams. She has been thought to have some allergies and has been tested. She's also been noted to have some low back pain with radiation down her right leg and stops above the knee. She continues to have some minimal numbness on the soles of her left foot but primarily she is now having more and numbness in the right side.  Medications: I have reviewed the patient's current medications.  Allergies:  Allergies  Allergen Reactions  . Penicillins Rash    Past Medical History, Surgical history, Social history, and Family History were reviewed and updated.  Review of Systems: Constitutional:  Negative for fever, chills, night sweats, anorexia, weight loss, pain. Cardiovascular: no chest pain or dyspnea on exertion Respiratory: no cough, shortness of breath, or wheezing Neurological: no TIA or stroke symptoms Dermatological: negative ENT: negative Skin Gastrointestinal: no abdominal pain, change in bowel habits, or black or bloody stools Genito-Urinary: no dysuria, trouble voiding, or hematuria Hematological and Lymphatic: negative Breast: negative for breast lumps Musculoskeletal: She continues to have symptoms of peripheral neuropathy especially evenings. Remaining ROS negative.  Physical Exam: Blood pressure 156/83, pulse 85, temperature 99.1 F (37.3 C), temperature source Oral, resp. rate 20, height 5\' 2"  (1.575 m), weight 175 lb 12.8 oz (79.742  kg). ECOG: 0 General appearance: alert, cooperative and appears stated age Head: Normocephalic, without obvious abnormality, atraumatic Neck: no adenopathy, no carotid bruit, no JVD, supple, symmetrical, trachea midline and thyroid not enlarged, symmetric, no tenderness/mass/nodules Lymph nodes: Cervical, supraclavicular, and axillary nodes normal. Cardiac : regular rate and rhythm, no murmurs or gallops Pulmonary:clear to auscultation bilaterally and normal percussion bilaterally Breasts: inspection negative, no nipple discharge or bleeding, no masses or nodularity palpable Abdomen:soft, non-tender; bowel sounds normal; no masses,  no organomegaly Extremities negative Neuro: alert, oriented, normal speech, no focal findings or movement disorder noted  Lab Results: Lab Results  Component Value Date   WBC 8.8 10/02/2011   HGB 12.9 10/02/2011   HCT 37.8 10/02/2011   MCV 98.4 10/02/2011   PLT 198 10/02/2011     Chemistry      Component Value Date/Time   NA 143 10/02/2011 1235   NA 141 03/28/2011 1305   K 3.8 10/02/2011 1235   K 3.5 03/28/2011 1305   CL 106 10/02/2011 1235   CL 105 03/28/2011 1305   CO2 26 10/02/2011 1235   CO2 26 03/28/2011 1305   BUN 17.0 10/02/2011 1235   BUN 11 03/28/2011 1305   CREATININE 0.8 10/02/2011 1235   CREATININE 0.72 03/28/2011 1305      Component Value Date/Time   CALCIUM 9.2 10/02/2011 1235   CALCIUM 9.2 03/28/2011 1305   CALCIUM 9.3 12/25/2010 1428   ALKPHOS 71 10/02/2011 1235   ALKPHOS 79 01/18/2011 1339   AST 35* 10/02/2011 1235   AST 37 01/18/2011 1339   ALT 45 10/02/2011 1235   ALT 39* 01/18/2011 1339   BILITOT 0.40 10/02/2011 1235   BILITOT 0.4 01/18/2011 1339      .pathology.  Radiological Studies: chest X-ray n/a Mammogram 3/13 Bone density n/a  Impression and Plan: Pleasant 38 -year-old woman with history of breast cancer status post chemotherapy and radiation. Some of new issues if there is a rash . This appears to be urticarial in nature. It is  unclear as to etiology. I have suggested this may be related to tamoxifen so I've also suggested she stop this for a month. I've also recommended that she get an MRI of her lower back and pelvis. She did have an MRI of her lumbar sacral spine 2004 which showed multi-disc issues and some degree of lumbar spinal stenosis. Some of her pain may be related to that less likely this is related to her cancer. I plan to see her back in a few months. Of note is that after the last iron infusion she felt much better.  More than 50% of the visit was spent in patient-related counselling   Pierce Crane, MD 9/18/20134:34 PM

## 2011-10-09 NOTE — Telephone Encounter (Signed)
10-17-2011 mri at [facility] on 10-17-2011 arrival time 12:45pm [address]  Gave patient appointment for md on 11-20-2011 at 2:00pm

## 2011-10-10 ENCOUNTER — Other Ambulatory Visit: Payer: Self-pay | Admitting: Emergency Medicine

## 2011-10-10 DIAGNOSIS — C50919 Malignant neoplasm of unspecified site of unspecified female breast: Secondary | ICD-10-CM

## 2011-10-17 ENCOUNTER — Other Ambulatory Visit: Payer: Medicare Other

## 2011-10-17 ENCOUNTER — Encounter: Payer: Self-pay | Admitting: Oncology

## 2011-10-17 NOTE — Progress Notes (Signed)
10/17/2011  This patient has a request that is pending peer-to-peer for a PELVIS MRI WITH/WITHOUT CONTRAST.  This procedure has been rescheduled for Oct. 2, 2013 @ [facility].  I have provided below the necessary information for your PA,NP or yourself to to the peer-to-peer.  [phone]  Opt. 3. Case Number# [id number] Cpt 95621 Pelvis Mri   Thank you,  Bonita Quin [phone]

## 2011-10-22 ENCOUNTER — Encounter: Payer: Self-pay | Admitting: Oncology

## 2011-10-22 ENCOUNTER — Encounter: Payer: Self-pay | Admitting: *Deleted

## 2011-10-22 NOTE — Progress Notes (Unsigned)
10/22/2011 I spoke with this patient and explained to her that only the Lumbar Spine MRI will be done tomorrow because of the insurance denial.  After review of the lumbar results, Dr. Donnie Coffin may again order the pelvis mri.  Patient vocied understanding but was disappointed in the denial.  Bonita Quin [phone]

## 2011-10-23 ENCOUNTER — Other Ambulatory Visit: Payer: Medicare Other

## 2011-10-23 ENCOUNTER — Ambulatory Visit
Admission: RE | Admit: 2011-10-23 | Discharge: 2011-10-23 | Disposition: A | Discharge status: Home / Self Care | Payer: Medicare Other | Source: Ambulatory Visit | Attending: Oncology | Admitting: Oncology

## 2011-10-23 DIAGNOSIS — C50919 Malignant neoplasm of unspecified site of unspecified female breast: Secondary | ICD-10-CM

## 2011-10-23 MED ORDER — GADOBENATE DIMEGLUMINE 529 MG/ML IV SOLN
15.0000 mL | Freq: Once | INTRAVENOUS | Status: AC | PRN
  Administered 2011-10-23: 15 mL via INTRAVENOUS

## 2011-11-05 ENCOUNTER — Other Ambulatory Visit: Payer: Self-pay | Admitting: Endocrinology

## 2011-11-05 ENCOUNTER — Other Ambulatory Visit: Payer: Self-pay | Admitting: Internal Medicine

## 2011-11-12 ENCOUNTER — Ambulatory Visit (INDEPENDENT_AMBULATORY_CARE_PROVIDER_SITE_OTHER): Payer: Medicare Other

## 2011-11-12 DIAGNOSIS — Z23 Encounter for immunization: Secondary | ICD-10-CM

## 2011-11-20 ENCOUNTER — Telehealth: Payer: Self-pay | Admitting: *Deleted

## 2011-11-20 ENCOUNTER — Ambulatory Visit (HOSPITAL_BASED_OUTPATIENT_CLINIC_OR_DEPARTMENT_OTHER): Payer: Medicare Other | Admitting: Oncology

## 2011-11-20 VITALS — BP 139/82 | HR 94 | Temp 98.2°F | Resp 20 | Ht 62.0 in | Wt 179.9 lb

## 2011-11-20 DIAGNOSIS — C50919 Malignant neoplasm of unspecified site of unspecified female breast: Secondary | ICD-10-CM

## 2011-11-20 DIAGNOSIS — M545 Low back pain, unspecified: Secondary | ICD-10-CM

## 2011-11-20 DIAGNOSIS — C50419 Malignant neoplasm of upper-outer quadrant of unspecified female breast: Secondary | ICD-10-CM

## 2011-11-20 NOTE — Telephone Encounter (Signed)
Gave patient appointment for 11-26-2011 scan 01-2012

## 2011-11-20 NOTE — Progress Notes (Signed)
Hematology and Oncology Follow Up Visit  [name] [name] [id number] 02/18/1945 66 y.o. 11/20/2011 2:30 PM PCP  Principle Diagnosis: :  History of T1c N0, ER, PR-positive, HER-2/neu-negative breast cancer, status post 4 of TC chemotherapy, last cycle completed 09/08/2009.  Status post radiation therapy completed 12/08/2009, previously  AI therapy, now tamoxifen   Interim History:  Patient returns for followup. She continues to have low back pain with radiation down her right leg. 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 She is been seen by her orthopedic surgeon and she is due to have knee surgery. They have offered appropriate to give her an injection to her lower spine. She is achieving getting an MRI of her head that she thinks a lot of her problem this area. Of note is that her rash on her face is improved after she stop tamoxifen.  Medications: I have reviewed the patient's current medications.  Allergies:  Allergies  Allergen Reactions  . Penicillins Rash    Past Medical History, Surgical history, Social history, and Family History were reviewed and updated.  Review of Systems: Constitutional:  Negative for fever, chills, night sweats, anorexia, weight loss, pain. Cardiovascular: no chest pain or dyspnea on exertion Respiratory: no cough, shortness of breath, or wheezing Neurological: no TIA or stroke symptoms Dermatological: negative ENT: negative Skin Gastrointestinal: no abdominal pain, change in bowel habits, or black or bloody stools Genito-Urinary: no dysuria, trouble voiding, or hematuria Hematological and Lymphatic: negative Breast: negative for breast lumps Musculoskeletal: She continues to have symptoms of peripheral neuropathy especially evenings. Remaining ROS negative.  Physical Exam: Blood pressure 139/82, pulse 94, temperature 98.2 F (36.8 C), resp. rate  20, height 5\' 2"  (1.575 m), weight 179 lb 14.4 oz (81.602 kg). ECOG: 0 General appearance: alert, cooperative and appears stated age Head: Normocephalic, without obvious abnormality, atraumatic Neck: no adenopathy, no carotid bruit, no JVD, supple, symmetrical, trachea midline and thyroid not enlarged, symmetric, no tenderness/mass/nodules Lymph nodes: Cervical, supraclavicular, and axillary nodes normal. Cardiac : regular rate and rhythm, no murmurs or gallops Pulmonary:clear to auscultation bilaterally and normal percussion bilaterally Breasts: inspection negative, no nipple discharge or bleeding, no masses or nodularity palpable Abdomen:soft, non-tender; bowel sounds normal; no masses,  no organomegaly Extremities negative Neuro: alert, oriented, normal speech, no focal findings or movement disorder noted  Lab Results: Lab Results  Component Value Date   WBC 8.8 10/02/2011   HGB 12.9 10/02/2011   HCT 37.8 10/02/2011   MCV 98.4 10/02/2011   PLT 198 10/02/2011     Chemistry      Component Value Date/Time   NA 143 10/02/2011 1235   NA 141 03/28/2011 1305   K 3.8 10/02/2011 1235   K 3.5 03/28/2011 1305   CL 106 10/02/2011 1235   CL 105 03/28/2011 1305   CO2 26 10/02/2011 1235   CO2 26 03/28/2011 1305   BUN 17.0 10/02/2011 1235   BUN 11 03/28/2011 1305   CREATININE 0.8 10/02/2011 1235   CREATININE 0.72 03/28/2011 1305      Component Value Date/Time   CALCIUM 9.2 10/02/2011 1235   CALCIUM 9.2 03/28/2011 1305   CALCIUM 9.3 12/25/2010 1428   ALKPHOS 71 10/02/2011 1235   ALKPHOS 79 01/18/2011 1339   AST 35* 10/02/2011 1235   AST 37 01/18/2011 1339   ALT 45 10/02/2011 1235   ALT 39* 01/18/2011 1339   BILITOT 0.40 10/02/2011  1235   BILITOT 0.4 01/18/2011 1339      .pathology. Radiological Studies: chest X-ray n/a Mammogram 3/13 Bone density n/a  Impression and Plan: I will see the patient back in about 3 months time. I've ordered CT of the hip. Her plain films of the hips of the normal. I  once again told that I strongly suspect her problems in her lower back. She has also indicated that she had stopped of another medication when she stop tamoxifen. She is interested in restarting tamoxifen to see if her rash recurrence. I've agreed that she does not tolerate tamoxifen I would therefore put her on toremifene. She has tried to with her AI drugs which she did not tolerate. Her son-in-law in [place] is a Midwife and he may be tried to persuade her to come therefore other interventions for her back. She will be due for mammogram in March of next year. More than 50% of the visit was spent in patient-related counselling   Pierce Crane, MD 10/30/20132:30 PM

## 2011-11-25 ENCOUNTER — Ambulatory Visit: Payer: Medicare Other | Attending: Rheumatology | Admitting: Rehabilitative and Restorative Service Providers"

## 2011-11-25 DIAGNOSIS — M25559 Pain in unspecified hip: Secondary | ICD-10-CM | POA: Insufficient documentation

## 2011-11-25 DIAGNOSIS — M545 Low back pain, unspecified: Secondary | ICD-10-CM | POA: Insufficient documentation

## 2011-11-25 DIAGNOSIS — IMO0001 Reserved for inherently not codable concepts without codable children: Secondary | ICD-10-CM | POA: Insufficient documentation

## 2011-11-26 ENCOUNTER — Ambulatory Visit (HOSPITAL_COMMUNITY): Payer: Medicare Other

## 2011-11-27 ENCOUNTER — Encounter: Payer: Medicare Other | Admitting: Physical Therapy

## 2011-12-12 ENCOUNTER — Other Ambulatory Visit: Payer: Self-pay | Admitting: Oncology

## 2011-12-12 DIAGNOSIS — C50919 Malignant neoplasm of unspecified site of unspecified female breast: Secondary | ICD-10-CM

## 2012-01-10 ENCOUNTER — Ambulatory Visit: Payer: Medicare Other | Admitting: Physical Therapy

## 2012-01-13 ENCOUNTER — Other Ambulatory Visit: Payer: Self-pay | Admitting: *Deleted

## 2012-01-17 ENCOUNTER — Ambulatory Visit: Payer: Medicare Other

## 2012-01-24 ENCOUNTER — Ambulatory Visit: Payer: Medicare Other | Attending: Rheumatology

## 2012-01-24 DIAGNOSIS — IMO0001 Reserved for inherently not codable concepts without codable children: Secondary | ICD-10-CM | POA: Insufficient documentation

## 2012-01-24 DIAGNOSIS — M545 Low back pain, unspecified: Secondary | ICD-10-CM | POA: Insufficient documentation

## 2012-01-24 DIAGNOSIS — M25559 Pain in unspecified hip: Secondary | ICD-10-CM | POA: Insufficient documentation

## 2012-01-27 ENCOUNTER — Ambulatory Visit: Payer: Medicare Other

## 2012-01-29 ENCOUNTER — Ambulatory Visit: Payer: Medicare Other

## 2012-02-04 ENCOUNTER — Ambulatory Visit: Payer: Medicare Other

## 2012-02-05 ENCOUNTER — Telehealth: Payer: Self-pay | Admitting: Oncology

## 2012-02-05 NOTE — Telephone Encounter (Signed)
Pt called because she had an appt scheduled 02/18/12 with Dr> Donnie Coffin.   I offered an appt with a NP and then time to meet Dr. Darnelle Catalan- whom she recommended.  She said that is a waste of her time and wants to have an appt with Dr. Darnelle Catalan himself.  I explained that we will gladly do our best to honor her request however, he is being scheduled to see patients that are undergoing treatment currently.   I will place her name on the list and she will be called.   She would like to be seen second week of February, and her request is noted.

## 2012-02-17 ENCOUNTER — Encounter: Payer: Self-pay | Admitting: *Deleted

## 2012-02-17 NOTE — Progress Notes (Signed)
Called and spoke with patient concerning her follow up appt.  Patient is requesting Dr. Darnelle Catalan.  Offered patient an appt. With advanced practice provider and patient refused and only wants to see Dr. Darnelle Catalan.  Appt. Scheduled for 02/28/12 at 1130 with GM.  Pt. Aware of appt. Time and date.

## 2012-02-18 ENCOUNTER — Other Ambulatory Visit: Payer: Medicare Other | Admitting: Lab

## 2012-02-18 ENCOUNTER — Ambulatory Visit: Payer: Medicare Other | Admitting: Oncology

## 2012-02-19 ENCOUNTER — Encounter: Payer: Self-pay | Admitting: Oncology

## 2012-02-28 ENCOUNTER — Telehealth: Payer: Self-pay | Admitting: Oncology

## 2012-02-28 ENCOUNTER — Ambulatory Visit (HOSPITAL_BASED_OUTPATIENT_CLINIC_OR_DEPARTMENT_OTHER): Payer: Medicare Other | Admitting: Oncology

## 2012-02-28 ENCOUNTER — Ambulatory Visit: Payer: Medicare Other | Admitting: Lab

## 2012-02-28 VITALS — BP 147/74 | HR 76 | Temp 98.4°F | Resp 20 | Ht 62.0 in | Wt 175.3 lb

## 2012-02-28 DIAGNOSIS — R7309 Other abnormal glucose: Secondary | ICD-10-CM

## 2012-02-28 DIAGNOSIS — C50919 Malignant neoplasm of unspecified site of unspecified female breast: Secondary | ICD-10-CM

## 2012-02-28 DIAGNOSIS — C50419 Malignant neoplasm of upper-outer quadrant of unspecified female breast: Secondary | ICD-10-CM

## 2012-02-28 DIAGNOSIS — F411 Generalized anxiety disorder: Secondary | ICD-10-CM

## 2012-02-28 DIAGNOSIS — R7302 Impaired glucose tolerance (oral): Secondary | ICD-10-CM

## 2012-02-28 DIAGNOSIS — G620 Drug-induced polyneuropathy: Secondary | ICD-10-CM

## 2012-02-28 DIAGNOSIS — Z170 Estrogen receptor positive status [ER+]: Secondary | ICD-10-CM

## 2012-02-28 LAB — CBC WITH DIFFERENTIAL/PLATELET
Basophils Absolute: 0.1 10*3/uL (ref 0.0–0.1)
EOS%: 2.2 % (ref 0.0–7.0)
Eosinophils Absolute: 0.2 10*3/uL (ref 0.0–0.5)
HCT: 38.4 % (ref 34.8–46.6)
HGB: 13 g/dL (ref 11.6–15.9)
MCH: 32.8 pg (ref 25.1–34.0)
MCV: 96.9 fL (ref 79.5–101.0)
MONO%: 8 % (ref 0.0–14.0)
NEUT#: 5.5 10*3/uL (ref 1.5–6.5)
NEUT%: 55.2 % (ref 38.4–76.8)
RDW: 13.1 % (ref 11.2–14.5)
lymph#: 3.4 10*3/uL — ABNORMAL HIGH (ref 0.9–3.3)

## 2012-02-28 LAB — COMPREHENSIVE METABOLIC PANEL (CC13)
AST: 29 U/L (ref 5–34)
Alkaline Phosphatase: 78 U/L (ref 40–150)
BUN: 12.3 mg/dL (ref 7.0–26.0)
Creatinine: 0.7 mg/dL (ref 0.6–1.1)
Glucose: 82 mg/dl (ref 70–99)

## 2012-02-28 LAB — HEMOGLOBIN A1C: Hgb A1c MFr Bld: 5.9 % — ABNORMAL HIGH (ref <5.7)

## 2012-02-29 ENCOUNTER — Other Ambulatory Visit: Payer: Self-pay | Admitting: Oncology

## 2012-02-29 NOTE — Progress Notes (Signed)
ID: [name] [name]   DOB: 05-01-1945  MR#: [id number]  CSN#:[id number]  PCP: [name] Belling, MD GYN: [name] [name] SU: [name] [name] OTHER MD: [name] [name], [name] [name]   HISTORY OF PRESENT ILLNESS: From [name] [name]'s note 06/07/2009: "She undergoes annual screening mammography.  She has had previous biopsies of both breasts showing benign disease.  She had a screening mammogram on 04/13/2009, which showed a possible mass in the right breast, further imaging is recommended.  She subsequently had a digital diagnostic mammogram and right breast ultrasound on 04/21/2009.  This showed a hyperechoic mass at 7 o'clock position measuring  5 x 5 x 7 mm, this was suspicious.  A clip was placed and a biopsy was performed on 05/11/2009.  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  This was ER positive 100% and PR positive 17%, proliferative index 68%, the HER-2 was not amplified with a ratio of 0.94.  The patient subsequently had an MRI scan of both breasts on 05/15/2009 showed a solitary mass in the upper outer quadrant of the right breast measuring 1.2 x 1.1 x 0.8 cm.  She subsequently was seen at [facility], underwent a lumpectomy and sentinel lymph node evaluation on 05/29/2009 by Dr. Ashok Cordia [name].  Final pathology showed a high-grade invasive ductal cancer measuring 1.2 cm.  Five sentinel lymph nodes were identified, all of which were negative for metastatic disease.  The invasive carcinoma was less than 1 mm from lateral margin.  A minute focus of DCIS was subsequently seen in the lateral margin, seen with re-excision measured 1.5 mm from the margin.  The other margins appeared to be technically clear.  This had overall histological grade 3. "   Her subsequent history is as detailed [name] the      INTERVAL HISTORY: The patient returns today with her husband [name] [name] for followup of her breast cancer. Since her last visit  here, she had spinal surgery in [place]. She is recovering well from that, although she still has some pain down her right leg at times   REVIEW OF SYSTEMS:  the pain in her back could be stabbing or cramping, sometimes achy. It can radiate down her right leg. She tells me that in the last few days the right leg has felt a lot better but she has had some discomfort in the left leg. She has tinnitus and a history of mouth sores. She has a bit of a runny nose. Sometimes her ankles swell. She can be short of breath when walking up stairs, but can walk up a flight without stopping. She has a history of multiple urinary tract infections. She has persistent numbness in both feet, and frequently does not know whether she has shoes on or not. On the other hand she doesn't have any neuropathy in her hands. She feels anxious but not depressed. A detailed review of systems was otherwise noncontributory.   PAST MEDICAL HISTORY: Past Medical History  Diagnosis Date  . BREAST CANCER 07/04/2009  . HYPERTENSION 06/13/2007  . HYPERLIPIDEMIA 06/13/2007  . GLUCOSE INTOLERANCE 06/26/2007  . UNSPECIFIED OSTEOPOROSIS 11/18/2008  . OBSTRUCTIVE SLEEP APNEA 08/06/2007  . GOITER, MULTINODULAR 06/26/2007  . HYPOTHYROIDISM 06/13/2007  . ANXIETY 06/26/2007  . Unspecified Iron Deficiency Anemia 06/13/2007  . CARPAL TUNNEL SYNDROME, BILATERAL 06/13/2007  . ASTHMA 06/13/2007  . GERD 06/13/2007  . HIATAL HERNIA 06/13/2007  . Microscopic hematuria 01/03/2009  . OSTEOPENIA 06/26/2007  . HYPERSOMNIA 06/26/2007  . Myalgia  and myositis, unspecified     Myofascial pain syndrome  . Depression   . Chronic cystitis   . Hx of adenomatous colonic polyps   . H. pylori infection 2008  . Fatty liver   . History of radiation therapy 10/24/09-12/08/09    right breast cancer  . History of chemotherapy     taxoterte/cytoxan 4 cycles completed 09/08/09  . Hiatal hernia   . Cataract     extraction    PAST SURGICAL HISTORY: Past Surgical History   Procedure Laterality Date  . Abdominal hysterectomy    . Carpal tunnel release    . Urethral dilation      s/p  . Breast surgery      breast biopsy-neg x 2  . Cataract extraction    . Breast lumpectomy  04/2009    L breast   . Appendectomy    . Breast lumpectomy      right breast-cancerous    FAMILY HISTORY Family History  Problem Relation Age of Onset  . Asthma Mother   . Colon cancer Father 50  . Hypertension Sister   . Hypertension Brother   . Ulcerative colitis Son   . Asthma Son   . Stomach cancer Maternal Grandfather   . Thyroid disease Other     Aunt  . Arthritis Other   . Prostate cancer Other    the patient's father died at age 2 but she is not sure of the cause. The patient's mother is currently living, age 20. The patient had one brother and 4 sisters. There is no history of breast or ovarian cancer in the family to her knowledge.   GYNECOLOGIC HISTORY:  menarche age 6, first live birth age 63, she is GX P3. She had a hysterectomy approximately 32 years ago, and took hormone replacement for approximately 12 years after that.   SOCIAL HISTORY:  she used to work as a Wellsite geologist, but is now retired. Her husband [name] [name] is a retired professor in the social studies Department at [employer]. Daughter [name] [name] is a Animator in pediatric infectious diseases in [place]. Son [name] [name] is an Pensions consultant in [place]. Son [name] [name] works in Field seismologist in [place]. The patient has 4 biological and one adopted grandchildren. She is not involved in organized religion.    ADVANCED DIRECTIVES: not in place   HEALTH MAINTENANCE: History  Substance Use Topics  . Smoking status: Never Smoker   . Smokeless tobacco: Never Used  . Alcohol Use: No     Colonoscopy:  PAP:  Bone density:  Lipid panel:  Allergies  Allergen Reactions  . Penicillins Rash    Current Outpatient Prescriptions  Medication Sig Dispense Refill  . albuterol  (PROVENTIL,VENTOLIN) 90 MCG/ACT inhaler Inhale 2 puffs into the lungs every 6 (six) hours as needed.        Marland Kitchen augmented betamethasone dipropionate (DIPROLENE-AF) 0.05 % cream Apply topically daily.       . Calcium Carbonate-Vitamin D (CALTRATE 600+D) 600-400 MG-UNIT per tablet Take 1 tablet by mouth daily.        . cholecalciferol (VITAMIN D) 1000 UNITS tablet Take 1,000 Units by mouth daily.        . furosemide (LASIX) 20 MG tablet        . gabapentin (NEURONTIN) 100 MG capsule TAKE TWO CAPSULES BY MOUTH THREE TIMES DAILY  180 capsule  4  . ibuprofen (ADVIL,MOTRIN) 200 MG tablet As directed       . levothyroxine (  LEVOTHROID) 25 MCG tablet Take 1 tablet (25 mcg total) by mouth daily.  30 tablet  11  . NEXIUM 40 MG capsule TAKE ONE CAPSULE BY MOUTH DAILY.  30 capsule  2  . polyethylene glycol (MIRALAX / GLYCOLAX) packet as directed.      . pravastatin (PRAVACHOL) 80 MG tablet TAKE ONE TABLET BY MOUTH AT BEDTIME  30 tablet  11  . PROAIR HFA 108 (90 BASE) MCG/ACT inhaler INHALE TWO PUFFS EVERY 6 HOURS AS DIRECTED.  9 g  4  . SINGULAIR 10 MG tablet TAKE ONE TABLET BY MOUTH EVERY DAY AS NEEDED  30 tablet  10  . tamoxifen (NOLVADEX) 20 MG tablet TAKE ONE TABLET BY MOUTH ONCE DAILY.  30 tablet  2  . traMADol (ULTRAM) 50 MG tablet Take 2 tablets by mouth at bedtime.      . triamcinolone cream (KENALOG) 0.1 % Apply topically 2 (two) times daily.  30 g  0  . vitamin B-12 (CYANOCOBALAMIN) 100 MCG tablet Take 50 mcg by mouth daily.         No current facility-administered medications for this visit.    OBJECTIVE: middle-aged [place] Asian woman in no acute distress  Filed Vitals:   02/28/12 1115  BP: 147/74  Pulse: 76  Temp: 98.4 F (36.9 C)  Resp: 20     Body mass index is 32.05 kg/(m^2).    ECOG FS: 0  Sclerae unicteric Oropharynx clear No cervical or supraclavicular adenopathy Lungs no rales or rhonchi Heart regular rate and rhythm Abd benign MSK no focal spinal tenderness,  reason lower  spinal incision healing nicely; no peripheral edema Neuro: nonfocal, well oriented, positive affect Breasts:  the right breast is status post lumpectomy and radiation. There is no evidence of local recurrence. The the right axilla is benign. The left breast is unremarkable.    LAB RESULTS: Lab Results  Component Value Date   WBC 10.0 02/28/2012   NEUTROABS 5.5 02/28/2012   HGB 13.0 02/28/2012   HCT 38.4 02/28/2012   MCV 96.9 02/28/2012   PLT 210 02/28/2012      Chemistry      Component Value Date/Time   NA 141 02/28/2012 1328   NA 141 03/28/2011 1305   K 3.9 02/28/2012 1328   K 3.5 03/28/2011 1305   CL 106 02/28/2012 1328   CL 105 03/28/2011 1305   CO2 25 02/28/2012 1328   CO2 26 03/28/2011 1305   BUN 12.3 02/28/2012 1328   BUN 11 03/28/2011 1305   CREATININE 0.7 02/28/2012 1328   CREATININE 0.72 03/28/2011 1305      Component Value Date/Time   CALCIUM 9.0 02/28/2012 1328   CALCIUM 9.2 03/28/2011 1305   CALCIUM 9.3 12/25/2010 1428   ALKPHOS 78 02/28/2012 1328   ALKPHOS 79 01/18/2011 1339   AST 29 02/28/2012 1328   AST 37 01/18/2011 1339   ALT 37 02/28/2012 1328   ALT 39* 01/18/2011 1339   BILITOT 0.32 02/28/2012 1328   BILITOT 0.4 01/18/2011 1339       Lab Results  Component Value Date   LABCA2 4 03/28/2011    No components found with this basename: [id number]    No results found for this basename: INR,  in the last 168 hours  Urinalysis    Component Value Date/Time   COLORURINE LT. YELLOW 12/25/2010 1428   APPEARANCEUR CLEAR 12/25/2010 1428   LABSPEC 1.010 12/25/2010 1428   LABSPEC 1.005 08/18/2009 1244   PHURINE 6.5 12/25/2010 1428  HGBUR LARGE 12/25/2010 1428   BILIRUBINUR NEGATIVE 12/25/2010 1428   KETONESUR NEGATIVE 12/25/2010 1428   UROBILINOGEN 0.2 12/25/2010 1428   NITRITE NEGATIVE 12/25/2010 1428   LEUKOCYTESUR SMALL 12/25/2010 1428    STUDIES: No results found. Next mammogram and bone density due next month  ASSESSMENT: 67 y.o. [name] woman originally from [place]  (1) status post  right lumpectomy 05/29/2009 for a  pT1c pN0, stage IA invasive ductal carcinoma, grade 3, estrogen receptor 100% and progesterone receptor 17% positive, with an MIB-1 of 68% and no HER-2 amplification.  (2) Oncotype score of 36 fell in the "high-risk" box, and predicted a 24% risk of distant recurrence within 10 years if the patient's only systemic treatment was tamoxifen for 5 years  (3) received cyclophosphamide/ docetaxel x4, completed 09/08/2009  (4) completed adjuvant radiation 12/08/2009  (5) tried letrozole and exemestane with significant side effects, but started tamoxifen in July 2012 and has tolerated it well.  PLAN:  Serina is doing well from a breast cancer point of view and the plan is going to be to continue tamoxifen to a total of 10 years. She is due for mammography and repeat DEXA scan, and those orders have been placed for next month.  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 This is going to be due to her docetaxel chemotherapy and is likely to be permanent. We really don't know of anything that resolves the issue. She is receiving gabapentin with some benefit and we will continue that. She has a history of mouth sores and I suggested taking antivirals chronically for this, but it is not enough of a problem for her to want to take another medication. She wonders if she might have diabetes and we have added a hemoglobin A1c to her lab work today. Otherwise the plan is to continue to follow her on a every 6 month basis until she completes 5 years from her surgery, and then yearly for the final 5 years. She knows to call for any problems that may develop before the next visit.    Becky Berberian C    02/29/2012

## 2012-03-02 ENCOUNTER — Other Ambulatory Visit: Payer: Self-pay | Admitting: Oncology

## 2012-04-01 ENCOUNTER — Other Ambulatory Visit: Payer: Self-pay | Admitting: Oncology

## 2012-04-01 DIAGNOSIS — C50911 Malignant neoplasm of unspecified site of right female breast: Secondary | ICD-10-CM

## 2012-04-06 ENCOUNTER — Ambulatory Visit (INDEPENDENT_AMBULATORY_CARE_PROVIDER_SITE_OTHER): Payer: Medicare Other | Admitting: Pulmonary Disease

## 2012-04-06 ENCOUNTER — Encounter: Payer: Self-pay | Admitting: Pulmonary Disease

## 2012-04-06 VITALS — BP 120/72 | HR 87 | Temp 98.5°F | Ht 62.5 in | Wt 179.0 lb

## 2012-04-06 DIAGNOSIS — J209 Acute bronchitis, unspecified: Secondary | ICD-10-CM

## 2012-04-06 DIAGNOSIS — J45909 Unspecified asthma, uncomplicated: Secondary | ICD-10-CM | POA: Insufficient documentation

## 2012-04-06 MED ORDER — MOXIFLOXACIN HCL 400 MG PO TABS: 400.0000 mg | ORAL_TABLET | Freq: Every day | ORAL | Status: DC

## 2012-04-06 NOTE — Assessment & Plan Note (Signed)
The patient's history is most consistent with acute bronchitis.  We'll treat her with a course of antibiotics, and can use Mucinex for symptomatic relief.

## 2012-04-06 NOTE — Patient Instructions (Addendum)
Will treat with a course of avelox 400mg  one a day for 5 days for acute bronchitis. Ok to keep using mucinex dm, and can use advil cold and sinus for your sore throat and upper respiratory symptoms.  Let us know if you are not improving

## 2012-04-06 NOTE — Progress Notes (Signed)
  Subjective:    Patient ID: [name] [name], female    DOB: [birth year], 67 y.o.   MRN: [id number]  HPI The patient comes in today for an acute sick visit.  She has obstructive sleep apnea, and upper airway dysfunction probably on the basis of postnasal drip/allergies and also laryngopharyngeal reflux.  Her workup for asthma has been unrevealing.  She comes in today with a 5 day history of worsening chest congestion, cough with purulent mucus, as well as a sore throat and malaise.  She has had low grade fever with some chills.  She has not had worsening shortness of breath.  She has some sinus pressure and a right earache.   Review of Systems  Constitutional: Negative for fever and unexpected weight change.  HENT: Positive for congestion, sneezing and postnasal drip. Negative for ear pain, nosebleeds, sore throat, rhinorrhea, trouble swallowing, dental problem and sinus pressure.   Eyes: Negative for redness and itching.  Respiratory: Positive for cough and chest tightness. Negative for shortness of breath and wheezing.   Cardiovascular: Negative for palpitations and leg swelling.  Gastrointestinal: Negative for nausea and vomiting.  Genitourinary: Negative for dysuria.  Musculoskeletal: Negative for joint swelling.  Skin: Negative for rash.  Neurological: Negative for headaches.  Hematological: Does not bruise/bleed easily.  Psychiatric/Behavioral: Negative for dysphoric mood. The patient is not nervous/anxious.        Objective:   Physical Exam Overweight female in no acute distress Nose with purulent discharge noted, only minimal erythema Ears with clear TMs, drums normal Oropharynx with no erythema or exudates Neck without lymphadenopathy or thyromegaly Chest totally clear to auscultation, no wheezing Cardiac exam is regular rate and rhythm Lower extremities with mild edema, no cyanosis Alert and oriented, moves all 4 extremities.       Assessment & Plan:

## 2012-04-15 ENCOUNTER — Telehealth: Payer: Self-pay | Admitting: Pulmonary Disease

## 2012-04-15 NOTE — Telephone Encounter (Signed)
Spoke with pt  She was seen on 3/17 for acute bronchitis and txed with avelox x 5 days She states that she felt that she was improving since finishing abx, but last night started to have increased chest congestion  She states her cough started back also, but is now non prod Having some increased SOB, no worse since last ov Not taking mucinex any longer Please advise thanks! Allergies  Allergen Reactions  . Penicillins Rash

## 2012-04-15 NOTE — Telephone Encounter (Signed)
Very unlikely to be bronchitis again after good antibiotic treatment.  May be postnasal drip from allergies, or ?sinus infection.  I would like to have her use mucinex dm extra strength  One in am and pm for a week. Also try chlorpheniramine 4mg , take 2 at bedtime to see if helps.

## 2012-04-15 NOTE — Telephone Encounter (Signed)
Spoke with pt and notified of recs per [facility] She verbalized understanding and states nothing further needed Will call back if not improving

## 2012-04-30 ENCOUNTER — Other Ambulatory Visit: Payer: Self-pay | Admitting: Oncology

## 2012-04-30 DIAGNOSIS — C50919 Malignant neoplasm of unspecified site of unspecified female breast: Secondary | ICD-10-CM

## 2012-05-06 ENCOUNTER — Ambulatory Visit
Admission: RE | Admit: 2012-05-06 | Discharge: 2012-05-06 | Disposition: A | Discharge status: Home / Self Care | Payer: Medicare Other | Source: Ambulatory Visit | Attending: Oncology | Admitting: Oncology

## 2012-05-06 DIAGNOSIS — C50919 Malignant neoplasm of unspecified site of unspecified female breast: Secondary | ICD-10-CM

## 2012-05-06 DIAGNOSIS — R7302 Impaired glucose tolerance (oral): Secondary | ICD-10-CM

## 2012-05-12 ENCOUNTER — Encounter: Payer: Medicare Other | Admitting: Endocrinology

## 2012-05-18 ENCOUNTER — Encounter (HOSPITAL_COMMUNITY): Admission: RE | Payer: Self-pay | Source: Ambulatory Visit

## 2012-05-18 ENCOUNTER — Inpatient Hospital Stay (HOSPITAL_COMMUNITY): Admission: RE | Admit: 2012-05-18 | Payer: Medicare Other | Source: Ambulatory Visit | Admitting: Orthopedic Surgery

## 2012-05-18 SURGERY — ARTHROPLASTY, KNEE, TOTAL
Anesthesia: Choice | Site: Knee | Laterality: Right

## 2012-05-20 ENCOUNTER — Encounter: Payer: Medicare Other | Admitting: Endocrinology

## 2012-05-20 ENCOUNTER — Telehealth: Payer: Self-pay | Admitting: Emergency Medicine

## 2012-05-20 NOTE — Telephone Encounter (Signed)
Informed patient of her recent bone density results. Patient complaining of muscle pains in her arms and legs. States she has cramps in her thighs and has trouble standing when she gets up in the mornings. Patient states she is currently taking Aleve daily for those pains. Patient inquiring if she should be taking prescription strength Vit D. Will review with Dr Darnelle Catalan and notify patient of further instructions. Patient verbalized understanding.

## 2012-05-22 ENCOUNTER — Telehealth: Payer: Self-pay | Admitting: *Deleted

## 2012-05-22 NOTE — Telephone Encounter (Signed)
Per MD review of pt's inquiry regarding dose for vitamin D - recommendation given for pt to take 2000 units daily. This RN called to given number, spoke with [name] [name] who stated pt is not available -   Above discussed with husband verbalizing understanding of dose.

## 2012-05-25 ENCOUNTER — Encounter: Payer: Self-pay | Admitting: Endocrinology

## 2012-05-25 ENCOUNTER — Other Ambulatory Visit: Payer: Self-pay

## 2012-05-25 ENCOUNTER — Ambulatory Visit (INDEPENDENT_AMBULATORY_CARE_PROVIDER_SITE_OTHER): Payer: Medicare Other | Admitting: Endocrinology

## 2012-05-25 VITALS — BP 126/76 | HR 95 | Wt 175.0 lb

## 2012-05-25 DIAGNOSIS — E739 Lactose intolerance, unspecified: Secondary | ICD-10-CM

## 2012-05-25 DIAGNOSIS — Z0000 Encounter for general adult medical examination without abnormal findings: Secondary | ICD-10-CM

## 2012-05-25 DIAGNOSIS — M5136 Other intervertebral disc degeneration, lumbar region: Secondary | ICD-10-CM

## 2012-05-25 DIAGNOSIS — M791 Myalgia, unspecified site: Secondary | ICD-10-CM

## 2012-05-25 DIAGNOSIS — M5137 Other intervertebral disc degeneration, lumbosacral region: Secondary | ICD-10-CM

## 2012-05-25 DIAGNOSIS — E785 Hyperlipidemia, unspecified: Secondary | ICD-10-CM

## 2012-05-25 DIAGNOSIS — R3129 Other microscopic hematuria: Secondary | ICD-10-CM

## 2012-05-25 DIAGNOSIS — M51379 Other intervertebral disc degeneration, lumbosacral region without mention of lumbar back pain or lower extremity pain: Secondary | ICD-10-CM

## 2012-05-25 DIAGNOSIS — E039 Hypothyroidism, unspecified: Secondary | ICD-10-CM

## 2012-05-25 DIAGNOSIS — M51369 Other intervertebral disc degeneration, lumbar region without mention of lumbar back pain or lower extremity pain: Secondary | ICD-10-CM | POA: Insufficient documentation

## 2012-05-25 DIAGNOSIS — D509 Iron deficiency anemia, unspecified: Secondary | ICD-10-CM

## 2012-05-25 DIAGNOSIS — IMO0001 Reserved for inherently not codable concepts without codable children: Secondary | ICD-10-CM

## 2012-05-25 NOTE — Progress Notes (Signed)
Subjective:    Patient ID: [name] [name], female    DOB: 26-Aug-1945, 67 y.o.   MRN: [id number]  HPI here for regular wellness examination.  she's feeling pretty well in general, and says chronic med probs are stable, except as noted below Past Medical History  Diagnosis Date  . BREAST CANCER 07/04/2009  . HYPERTENSION 06/13/2007  . HYPERLIPIDEMIA 06/13/2007  . GLUCOSE INTOLERANCE 06/26/2007  . UNSPECIFIED OSTEOPOROSIS 11/18/2008  . OBSTRUCTIVE SLEEP APNEA 08/06/2007  . GOITER, MULTINODULAR 06/26/2007  . HYPOTHYROIDISM 06/13/2007  . ANXIETY 06/26/2007  . Unspecified Iron Deficiency Anemia 06/13/2007  . CARPAL TUNNEL SYNDROME, BILATERAL 06/13/2007  . GERD 06/13/2007  . HIATAL HERNIA 06/13/2007  . Microscopic hematuria 01/03/2009  . OSTEOPENIA 06/26/2007  . HYPERSOMNIA 06/26/2007  . Myalgia and myositis, unspecified     Myofascial pain syndrome  . Depression   . Chronic cystitis   . Hx of adenomatous colonic polyps   . H. pylori infection 2008  . Fatty liver   . History of radiation therapy 10/24/09-12/08/09    right breast cancer  . History of chemotherapy     taxoterte/cytoxan 4 cycles completed 09/08/09  . Hiatal hernia   . Cataract     extraction    Past Surgical History  Procedure Laterality Date  . Abdominal hysterectomy    . Carpal tunnel release    . Urethral dilation      s/p  . Breast surgery      breast biopsy-neg x 2  . Cataract extraction    . Breast lumpectomy  04/2009    L breast   . Appendectomy    . Breast lumpectomy      right breast-cancerous    History   Social History  . Marital Status: Married    Spouse Name: N/A    Number of Children: 3  . Years of Education: N/A   Occupational History  . Retired    Social History Main Topics  . Smoking status: Never Smoker   . Smokeless tobacco: Never Used  . Alcohol Use: No  . Drug Use: No  . Sexually Active: Not on file   Other Topics Concern  . Not on file   Social History Narrative   Retired Charity fundraiser    Current Outpatient Prescriptions on File Prior to Visit  Medication Sig Dispense Refill  . albuterol (PROVENTIL,VENTOLIN) 90 MCG/ACT inhaler Inhale 2 puffs into the lungs every 6 (six) hours as needed.        Marland Kitchen augmented betamethasone dipropionate (DIPROLENE-AF) 0.05 % cream Apply topically daily.       . Calcium Carbonate-Vitamin D (CALTRATE 600+D) 600-400 MG-UNIT per tablet Take 1 tablet by mouth daily.        . cholecalciferol (VITAMIN D) 1000 UNITS tablet Take 1,000 Units by mouth daily.        . furosemide (LASIX) 20 MG tablet Take 20 mg by mouth as needed.       . gabapentin (NEURONTIN) 100 MG capsule TAKE TWO CAPSULES BY MOUTH THREE TIMES DAILY  180 capsule  4  . ibuprofen (ADVIL,MOTRIN) 200 MG tablet As directed       . levothyroxine (LEVOTHROID) 25 MCG tablet Take 1 tablet (25 mcg total) by mouth daily.  30 tablet  11  . LORazepam (ATIVAN) 0.5 MG tablet TAKE ONE TABLET BY MOUTH EVERY 6 HOURS AS NEEDED  60 tablet  0  . moxifloxacin (AVELOX) 400 MG tablet Take 1 tablet (400 mg total) by mouth  daily.  4 tablet  0  . NEXIUM 40 MG capsule TAKE ONE CAPSULE BY MOUTH DAILY.  30 capsule  2  . SINGULAIR 10 MG tablet TAKE ONE TABLET BY MOUTH EVERY DAY AS NEEDED  30 tablet  10  . tamoxifen (NOLVADEX) 20 MG tablet TAKE ONE TABLET BY MOUTH EVERY DAY  30 tablet  6  . vitamin B-12 (CYANOCOBALAMIN) 100 MCG tablet Take 50 mcg by mouth daily.         No current facility-administered medications on file prior to visit.    Allergies  Allergen Reactions  . Penicillins Rash    Family History  Problem Relation Age of Onset  . Asthma Mother   . Colon cancer Father 30  . Hypertension Sister   . Hypertension Brother   . Ulcerative colitis Son   . Asthma Son   . Stomach cancer Maternal Grandfather   . Thyroid disease Other     Aunt  . Arthritis Other   . Prostate cancer Other     BP 126/76  Pulse 95  Wt 175 lb (79.379 kg)  BMI 31.48 kg/m2  SpO2 96%     Review of  Systems  Constitutional: Negative for unexpected weight change.  HENT: Negative for hearing loss.   Eyes: Negative for visual disturbance.  Respiratory: Negative for shortness of breath.   Cardiovascular: Negative for chest pain.  Gastrointestinal: Negative for anal bleeding.  Endocrine: Positive for cold intolerance.  Genitourinary: Negative for hematuria.  Musculoskeletal: Negative for gait problem.  Skin: Negative for color change.  Allergic/Immunologic: Positive for environmental allergies.  Neurological: Negative for syncope and headaches.  Hematological: Does not bruise/bleed easily.  Psychiatric/Behavioral: Negative for dysphoric mood.       Objective:   Physical Exam VS: see vs page GEN: no distress HEAD: head: no deformity eyes: no periorbital swelling, no proptosis external nose and ears are normal mouth: no lesion seen NECK: supple, thyroid is not enlarged CHEST WALL: no deformity LUNGS:  Clear to auscultation BREASTS:  No mass.  No d/c CV: reg rate and rhythm, no murmur ABD: abdomen is soft, nontender.  no hepatosplenomegaly.  not distended.  no hernia GENITALIA:  Normal external female.  Normal bimanual exam RECTAL: normal external and internal exam.  heme neg MUSCULOSKELETAL: muscle bulk and strength are grossly normal.  no obvious joint swelling.  gait is normal and steady EXTEMITIES: no deformity.  no ulcer on the feet.  feet are of normal color and temp.  no edema PULSES: dorsalis pedis intact bilat.  no carotid bruit NEURO:  cn 2-12 grossly intact.   readily moves all 4's.  sensation is intact to touch on the feet SKIN:  Normal texture and temperature.  No rash or suspicious lesion is visible.   NODES:  None palpable at the neck PSYCH: alert, oriented x3.  Does not appear anxious nor depressed.         Assessment & Plan:  Wellness visit today, with problems stable, except as noted.  we discussed code status.  pt requests full code, but would not want  to be started or maintained on artificial life-support measures if there was not a reasonable chance of recovery     SEPARATE EVALUATION FOLLOWS--EACH PROBLEM HERE IS NEW, NOT RESPONDING TO TREATMENT, OR POSES SIGNIFICANT RISK TO THE PATIENT'S HEALTH: Pt states 1 year of moderate myalgias, throughout the body, but worse at the thighs.  She has assoc numbness of the feet.   HISTORY OF THE PRESENT  ILLNESS: PAST MEDICAL HISTORY reviewed and up to date today REVIEW OF SYSTEMS: She stopped zocor due to rash.  She denies fever PHYSICAL EXAMINATION: VITAL SIGNS:  See vs page GENERAL: no distress Thighs: nontender IMPRESSION: Myalgias, new, uncertain etiology Dyslipidemia, therapy is limited by perceived drug intolerance.   PLAN: See instruction page

## 2012-05-25 NOTE — Patient Instructions (Addendum)
blood tests are being requested for you today.  We'll contact you with results. Let's check a test of the muscles and nerve endings.  you will receive a phone call, about a day and time for an appointment. please consider these measures for your health:  minimize alcohol.  do not use tobacco products.  have a colonoscopy at least every 10 years from age 69.  Women should have an annual mammogram from age 92.  keep firearms safely stored.  always use seat belts.  have working smoke alarms in your home.  see an eye doctor and dentist regularly.  never drive under the influence of alcohol or drugs (including prescription drugs).  those with fair skin should take precautions against the sun. please let me know what your wishes would be, if artificial life support measures should become necessary.  it is critically important to prevent falling down (keep floor areas well-lit, dry, and free of loose objects.  If you have a cane, walker, or wheelchair, you should use it, even for short trips around the house.  Also, try not to rush) Please return in 1 year.

## 2012-05-26 ENCOUNTER — Other Ambulatory Visit: Payer: Self-pay | Admitting: *Deleted

## 2012-05-26 ENCOUNTER — Other Ambulatory Visit: Payer: Self-pay | Admitting: Endocrinology

## 2012-05-26 LAB — CBC WITH DIFFERENTIAL/PLATELET
Basophils Absolute: 0.1 10*3/uL (ref 0.0–0.1)
Eosinophils Absolute: 0.2 10*3/uL (ref 0.0–0.7)
HCT: 37.1 % (ref 36.0–46.0)
Lymphs Abs: 3.2 10*3/uL (ref 0.7–4.0)
MCV: 95.5 fl (ref 78.0–100.0)
Monocytes Absolute: 0.6 10*3/uL (ref 0.1–1.0)
Neutrophils Relative %: 51.9 % (ref 43.0–77.0)
Platelets: 212 10*3/uL (ref 150.0–400.0)
RDW: 13.6 % (ref 11.5–14.6)
WBC: 8.5 10*3/uL (ref 4.5–10.5)

## 2012-05-26 LAB — LIPID PANEL
Cholesterol: 160 mg/dL (ref 0–200)
HDL: 51.1 mg/dL (ref >39.00)
Total CHOL/HDL Ratio: 3
VLDL: 48 mg/dL — ABNORMAL HIGH (ref 0.0–40.0)

## 2012-05-26 LAB — IBC PANEL
Iron: 49 ug/dL (ref 42–145)
Transferrin: 206.9 mg/dL — ABNORMAL LOW (ref 212.0–360.0)

## 2012-05-26 LAB — TSH: TSH: 0.47 u[IU]/mL (ref 0.35–5.50)

## 2012-05-26 MED ORDER — LEVOTHYROXINE SODIUM 25 MCG PO TABS: 25.0000 ug | ORAL_TABLET | Freq: Every day | ORAL | Status: DC

## 2012-05-27 ENCOUNTER — Other Ambulatory Visit: Payer: Self-pay | Admitting: Endocrinology

## 2012-05-27 DIAGNOSIS — M791 Myalgia, unspecified site: Secondary | ICD-10-CM

## 2012-05-28 ENCOUNTER — Telehealth: Payer: Self-pay

## 2012-05-28 NOTE — Telephone Encounter (Signed)
These symptoms are for a rheumatologist.  Please ask her about this.

## 2012-05-28 NOTE — Telephone Encounter (Signed)
Pt states he already has a rhuematologist, does not need a referral for that he states he is having bone and muscle aches and would like a referral to a neurologist, please advise

## 2012-06-08 ENCOUNTER — Other Ambulatory Visit: Payer: Self-pay | Admitting: Oncology

## 2012-06-08 DIAGNOSIS — C50919 Malignant neoplasm of unspecified site of unspecified female breast: Secondary | ICD-10-CM

## 2012-08-25 ENCOUNTER — Telehealth: Payer: Self-pay

## 2012-08-25 NOTE — Telephone Encounter (Signed)
Pt stated she called Dr. Raphael Gibney office and has an appointment tomorrow and will keep that appointment.

## 2012-08-25 NOTE — Telephone Encounter (Signed)
Can she come tomorrow? 

## 2012-08-25 NOTE — Telephone Encounter (Signed)
Pt states he feels bad and very fatigue would like to come in and be seen, can you see this patient?

## 2012-08-26 ENCOUNTER — Encounter: Payer: Self-pay | Admitting: Internal Medicine

## 2012-08-26 ENCOUNTER — Ambulatory Visit (INDEPENDENT_AMBULATORY_CARE_PROVIDER_SITE_OTHER): Payer: Medicare Other | Admitting: Internal Medicine

## 2012-08-26 ENCOUNTER — Other Ambulatory Visit (INDEPENDENT_AMBULATORY_CARE_PROVIDER_SITE_OTHER): Payer: Medicare Other

## 2012-08-26 VITALS — BP 160/90 | HR 88 | Temp 98.8°F | Ht 62.0 in | Wt 170.5 lb

## 2012-08-26 DIAGNOSIS — E538 Deficiency of other specified B group vitamins: Secondary | ICD-10-CM

## 2012-08-26 DIAGNOSIS — F411 Generalized anxiety disorder: Secondary | ICD-10-CM

## 2012-08-26 DIAGNOSIS — R5383 Other fatigue: Secondary | ICD-10-CM

## 2012-08-26 DIAGNOSIS — R5381 Other malaise: Secondary | ICD-10-CM

## 2012-08-26 LAB — TSH: TSH: 3.06 u[IU]/mL (ref 0.35–5.50)

## 2012-08-26 LAB — VITAMIN B12: Vitamin B-12: >1500 pg/mL — ABNORMAL HIGH (ref 211–911)

## 2012-08-26 MED ORDER — CYANOCOBALAMIN 1000 MCG/ML IJ SOLN
1000.0000 ug | Freq: Once | INTRAMUSCULAR | Status: AC
  Administered 2012-08-26: 1000 ug via INTRAMUSCULAR

## 2012-08-26 MED ORDER — ALPRAZOLAM 0.5 MG PO TABS: 0.5000 mg | ORAL_TABLET | Freq: Every day | ORAL | Status: DC | PRN

## 2012-08-26 NOTE — Progress Notes (Signed)
Subjective:    Patient ID: [name] [name], female    DOB: [birth year], 67 y.o.   MRN: [id number]  HPI  Here as Dr Everardo All no available, with c/o unusual fatigue for 4 wks without obvious cause to her, denies fever, depression, overt bleeding, and Pt denies chest pain, increased sob or doe, wheezing, orthopnea, PND, increased LE swelling, palpitations, dizziness or syncope.  Pt denies new neurological symptoms such as new headache, or facial or extremity weakness or numbness   Pt denies polydipsia, polyuria, or low sugar symptoms such as weakness or confusion improved with po intake.  Pt states overall good compliance with meds, trying to follow lower cholesterol, diabetic diet, wt overall stable but little exercise however.    Had significant labs done per Dr Everardo All may 2014 negative, d/w pt today.  Is S/p lumbar surgury approx 6 mo in [place] (family there), did well, s/p PT, still has periph numbness; back pain improved.  Was referred to Neurology per Dr Everardo All but was not able to do this due to the surgury.  Today c/o 4 wk severe fatigue similar to previous iron def state when she was tx with IV iron per oncology. Used to have b12 shots in years past, none recent. Due for f/u with Dr Everardo All soon, next wk. Specifically asks for thyroid repeat, and b12. OSA being treated well x 3-4 yrs with CPAP, good compliance.  Denies worsening depressive symptoms, suicidal ideation, or panic; has ongoing anxiety; has a physician relative who suggests to her mult labs to have done today, most of which already done may 2014. PT asks for xanax only for use with flying, as she will do soon.  Has appt today later with urology, Dr Patsi Sears for chronic UTI management, has been on exenteded course of nitrofurantoin for 6 mo Past Medical History  Diagnosis Date  . BREAST CANCER 07/04/2009  . HYPERTENSION 06/13/2007  . HYPERLIPIDEMIA 06/13/2007  . GLUCOSE INTOLERANCE 06/26/2007  . UNSPECIFIED OSTEOPOROSIS 11/18/2008  .  OBSTRUCTIVE SLEEP APNEA 08/06/2007  . GOITER, MULTINODULAR 06/26/2007  . HYPOTHYROIDISM 06/13/2007  . ANXIETY 06/26/2007  . Unspecified Iron Deficiency Anemia 06/13/2007  . CARPAL TUNNEL SYNDROME, BILATERAL 06/13/2007  . GERD 06/13/2007  . HIATAL HERNIA 06/13/2007  . Microscopic hematuria 01/03/2009  . OSTEOPENIA 06/26/2007  . HYPERSOMNIA 06/26/2007  . Myalgia and myositis, unspecified     Myofascial pain syndrome  . Depression   . Chronic cystitis   . Hx of adenomatous colonic polyps   . H. pylori infection 2008  . Fatty liver   . History of radiation therapy 10/24/09-12/08/09    right breast cancer  . History of chemotherapy     taxoterte/cytoxan 4 cycles completed 09/08/09  . Hiatal hernia   . Cataract     extraction   Past Surgical History  Procedure Laterality Date  . Abdominal hysterectomy    . Carpal tunnel release    . Urethral dilation      s/p  . Breast surgery      breast biopsy-neg x 2  . Cataract extraction    . Breast lumpectomy  04/2009    L breast   . Appendectomy    . Breast lumpectomy      right breast-cancerous    reports that she has never smoked. She has never used smokeless tobacco. She reports that she does not drink alcohol or use illicit drugs. family history includes Arthritis in her other; Asthma in her mother and son; Colon cancer (age of  onset: 31) in her father; Hypertension in her brother and sister; Prostate cancer in her other; Stomach cancer in her maternal grandfather; Thyroid disease in her other; and Ulcerative colitis in her son. Allergies  Allergen Reactions  . Penicillins Rash   Review of Systems  Constitutional: Negative for unexpected weight change, or unusual diaphoresis  HENT: Negative for tinnitus.   Eyes: Negative for photophobia and visual disturbance.  Respiratory: Negative for choking and stridor.   Gastrointestinal: Negative for vomiting and blood in stool.  Genitourinary: Negative for hematuria and decreased urine volume.   Musculoskeletal: Negative for acute joint swelling Skin: Negative for color change and wound.  Neurological: Negative for tremors and numbness other than noted  Psychiatric/Behavioral: Negative for decreased concentration or  hyperactivity.       Objective:   Physical Exam BP 160/90  Pulse 88  Temp(Src) 98.8 F (37.1 C) (Oral)  Ht 5\' 2"  (1.575 m)  Wt 170 lb 8 oz (77.338 kg)  BMI 31.18 kg/m2  SpO2 96% VS noted,  Constitutional: Pt appears well-developed and well-nourished.  HENT: Head: NCAT.  Right Ear: External ear normal.  Left Ear: External ear normal.  Eyes: Conjunctivae and EOM are normal. Pupils are equal, round, and reactive to light.  Neck: Normal range of motion. Neck supple.  Cardiovascular: Normal rate and regular rhythm.   Pulmonary/Chest: Effort normal and breath sounds normal.  Abd:  Soft, NT, non-distended, + BS Neurological: Pt is alert. Not confused  Skin: Skin is warm. No erythema.  Psychiatric: Pt behavior is normal. Thought content normal. 2+ nervous    Assessment & Plan:

## 2012-08-26 NOTE — Assessment & Plan Note (Addendum)
Hx and exam benign, for labs today as ordered, suspect anxiety as most important issue  Note:  Total time for pt hx, exam, review of record with pt in the room, determination of diagnoses and plan for further eval and tx is > 40 min, with over 50% spent in coordination and counseling of patient

## 2012-08-26 NOTE — Patient Instructions (Signed)
You had the B12 shot today Please take all new medication as prescribed - the alprazolam Please go to the LAB in the Basement (turn left off the elevator) for the tests to be done today You will be contacted by phone if any changes need to be made immediately.  Otherwise, you will receive a letter about your results with an explanation, but please check with MyChart first. Please keep your appointments with your specialists as you have planned - Dr Patsi Sears later today to see if the chronic infection has cleared Please keep your appointments with your specialists as you have planned - Dr Everardo All  Please remember to sign up for My Chart if you have not done so, as this will be important to you in the future with finding out test results, communicating by private email, and scheduling acute appointments online when needed.

## 2012-08-26 NOTE — Assessment & Plan Note (Signed)
Per pt hx of this, for b12 today IM, b12 level as well

## 2012-08-26 NOTE — Assessment & Plan Note (Signed)
Ok for xanax prn limited rx,  to f/u any worsening symptoms or concerns

## 2012-08-28 LAB — PROTEIN ELECTROPHORESIS, SERUM
Albumin ELP: 57.8 % (ref 55.8–66.1)
Alpha-1-Globulin: 5 % — ABNORMAL HIGH (ref 2.9–4.9)
Alpha-2-Globulin: 9.9 % (ref 7.1–11.8)
Gamma Globulin: 15 % (ref 11.1–18.8)

## 2012-08-31 ENCOUNTER — Telehealth: Payer: Self-pay | Admitting: *Deleted

## 2012-08-31 NOTE — Telephone Encounter (Signed)
Pharmacy states that patient requests a 2-3 month supply of Nexium as she is going out of town. Patient has hx of barretts esophagus. She was last seen for a procedure 08/2011. However, she has not gotten Nexium filled since 11/05/11. She needs to take Nexium EVERY day due to the Barretts. I have left a message for patient to call back so I can discuss with her.

## 2012-09-01 ENCOUNTER — Telehealth: Payer: Self-pay | Admitting: *Deleted

## 2012-09-01 ENCOUNTER — Other Ambulatory Visit: Payer: Self-pay | Admitting: *Deleted

## 2012-09-01 DIAGNOSIS — C50919 Malignant neoplasm of unspecified site of unspecified female breast: Secondary | ICD-10-CM

## 2012-09-01 MED ORDER — TAMOXIFEN CITRATE 20 MG PO TABS: ORAL_TABLET | ORAL | Status: DC

## 2012-09-01 MED ORDER — ESOMEPRAZOLE MAGNESIUM 40 MG PO CPDR: DELAYED_RELEASE_CAPSULE | ORAL | Status: DC

## 2012-09-01 MED ORDER — GABAPENTIN 100 MG PO CAPS: ORAL_CAPSULE | ORAL | Status: DC

## 2012-09-01 NOTE — Telephone Encounter (Addendum)
Called patient to clarify request for 90-day supply of tamoxifen and gabapentin.  Due to going out of town.  Next F/U is with Norina Buzzard PA on 10-27-2012.  Reports she has obtained flight that leaves September 25, 2012 and may return by the end of October but hasn't finalized the return because she doesn't know when she'll return.    1. Reports she is going to [place] to visit her family.  [place]  The daughter-n-law wants her to stay an extended period of time to watch the kids to take her board exams.   2. Was intending to call us anyway because she feels exhausted, drained with no strength.  Feels like she did when Dr. Donnie Coffin gave her iron infusions.  Has been on tamoxifen for three years and her children who are doctors say tamoxifen can cause you to feel unusually tired and weak.   3. Her husbands doctor, Dr. Oliver Barre drew labs on 08-26-2012 but would like to know if we will check labs before her travels.  The Vit B12, TSH and Protein electrophoresis were normal.   4. Will see Dr. Romero Belling endocrinologist on Thursday of this week.     Reportss she is eating and drinking fluids well.  Takes a sleeping pill or she wouldn't be able to sleep due to her neuropathy.  Has frequent UTI's and finished and antibiotic two weeks ago and the UTI is cleared up.  Informed her I will leave this information for Dr. Darnelle Catalan to review upon his return to the office.  Will submit the refill request for a 90-day supply and will alert staff to reschedule for a later f/u but she needs to keep appointments.  Says she will look forward to a return call.  Can be reached at [phone].

## 2012-09-01 NOTE — Telephone Encounter (Signed)
Patient states that she has been taking Nexium every day as prescribed. I have reiterated the importance of taking it daily due to her Barretts. She verbalizes understanding. I will send a 2 month supply to patient's pharmacy per her request.

## 2012-09-03 ENCOUNTER — Ambulatory Visit (INDEPENDENT_AMBULATORY_CARE_PROVIDER_SITE_OTHER): Payer: Medicare Other | Admitting: Endocrinology

## 2012-09-03 ENCOUNTER — Encounter: Payer: Self-pay | Admitting: Endocrinology

## 2012-09-03 ENCOUNTER — Other Ambulatory Visit: Payer: Self-pay

## 2012-09-03 VITALS — BP 134/72 | HR 76 | Ht 62.0 in | Wt 170.0 lb

## 2012-09-03 DIAGNOSIS — E739 Lactose intolerance, unspecified: Secondary | ICD-10-CM

## 2012-09-03 DIAGNOSIS — D649 Anemia, unspecified: Secondary | ICD-10-CM | POA: Insufficient documentation

## 2012-09-03 LAB — CBC WITH DIFFERENTIAL/PLATELET
Basophils Relative: 0.6 % (ref 0.0–3.0)
Hemoglobin: 12.6 g/dL (ref 12.0–15.0)
Lymphocytes Relative: 38.6 % (ref 12.0–46.0)
Monocytes Relative: 9.3 % (ref 3.0–12.0)
Neutro Abs: 4.3 10*3/uL (ref 1.4–7.7)
Neutrophils Relative %: 49.2 % (ref 43.0–77.0)
RBC: 3.89 Mil/uL (ref 3.87–5.11)
WBC: 8.7 10*3/uL (ref 4.5–10.5)

## 2012-09-03 LAB — IBC PANEL
Saturation Ratios: 32.7 % (ref 20.0–50.0)
Transferrin: 198.5 mg/dL — ABNORMAL LOW (ref 212.0–360.0)

## 2012-09-03 LAB — HEMOGLOBIN A1C: Hgb A1c MFr Bld: 5.8 % (ref 4.6–6.5)

## 2012-09-03 NOTE — Patient Instructions (Addendum)
Our office will call [facility], to verify that you have a refill of xanax available.   blood tests are being requested for you today.  We'll contact you with results.

## 2012-09-03 NOTE — Progress Notes (Signed)
Subjective:    Patient ID: [name] [name], female    DOB: 21-Jul-1945, 67 y.o.   MRN: [id number]  HPI Pt states few week of moderate hair loss throughout the head, and assoc anxiety.   Past Medical History  Diagnosis Date  . BREAST CANCER 07/04/2009  . HYPERTENSION 06/13/2007  . HYPERLIPIDEMIA 06/13/2007  . GLUCOSE INTOLERANCE 06/26/2007  . UNSPECIFIED OSTEOPOROSIS 11/18/2008  . OBSTRUCTIVE SLEEP APNEA 08/06/2007  . GOITER, MULTINODULAR 06/26/2007  . HYPOTHYROIDISM 06/13/2007  . ANXIETY 06/26/2007  . Unspecified Iron Deficiency Anemia 06/13/2007  . CARPAL TUNNEL SYNDROME, BILATERAL 06/13/2007  . GERD 06/13/2007  . HIATAL HERNIA 06/13/2007  . Microscopic hematuria 01/03/2009  . OSTEOPENIA 06/26/2007  . HYPERSOMNIA 06/26/2007  . Myalgia and myositis, unspecified     Myofascial pain syndrome  . Depression   . Chronic cystitis   . Hx of adenomatous colonic polyps   . H. pylori infection 2008  . Fatty liver   . History of radiation therapy 10/24/09-12/08/09    right breast cancer  . History of chemotherapy     taxoterte/cytoxan 4 cycles completed 09/08/09  . Hiatal hernia   . Cataract     extraction    Past Surgical History  Procedure Laterality Date  . Abdominal hysterectomy    . Carpal tunnel release    . Urethral dilation      s/p  . Breast surgery      breast biopsy-neg x 2  . Cataract extraction    . Breast lumpectomy  04/2009    L breast   . Appendectomy    . Breast lumpectomy      right breast-cancerous    History   Social History  . Marital Status: Married    Spouse Name: N/A    Number of Children: 3  . Years of Education: N/A   Occupational History  . Retired    Social History Main Topics  . Smoking status: Never Smoker   . Smokeless tobacco: Never Used  . Alcohol Use: No  . Drug Use: No  . Sexual Activity: Not on file   Other Topics Concern  . Not on file   Social History Narrative   Retired Teaching laboratory technician    Current Outpatient Prescriptions on  File Prior to Visit  Medication Sig Dispense Refill  . albuterol (PROVENTIL,VENTOLIN) 90 MCG/ACT inhaler Inhale 2 puffs into the lungs every 6 (six) hours as needed.        . ALPRAZolam (XANAX) 0.5 MG tablet Take 1 tablet (0.5 mg total) by mouth daily as needed for sleep.  30 tablet  1  . augmented betamethasone dipropionate (DIPROLENE-AF) 0.05 % cream Apply topically daily.       . Calcium Carbonate-Vitamin D (CALTRATE 600+D) 600-400 MG-UNIT per tablet Take 1 tablet by mouth daily.        . cholecalciferol (VITAMIN D) 1000 UNITS tablet Take 1,000 Units by mouth daily.        Marland Kitchen esomeprazole (NEXIUM) 40 MG capsule TAKE ONE CAPSULE BY MOUTH DAILY.  60 capsule  0  . furosemide (LASIX) 20 MG tablet Take 20 mg by mouth as needed.       . gabapentin (NEURONTIN) 100 MG capsule TAKE TWO CAPSULES BY MOUTH THREE TIMES DAILY  540 capsule  1  . ibuprofen (ADVIL,MOTRIN) 200 MG tablet As directed       . levothyroxine (LEVOTHROID) 25 MCG tablet Take 1 tablet (25 mcg total) by mouth daily.  30 tablet  10  .  LORazepam (ATIVAN) 0.5 MG tablet TAKE ONE TABLET BY MOUTH EVERY 6 HOURS AS NEEDED  60 tablet  4  . nitrofurantoin, macrocrystal-monohydrate, (MACROBID) 100 MG capsule       . PROAIR HFA 108 (90 BASE) MCG/ACT inhaler       . SINGULAIR 10 MG tablet TAKE ONE TABLET BY MOUTH EVERY DAY AS NEEDED  30 tablet  10  . tamoxifen (NOLVADEX) 20 MG tablet TAKE ONE TABLET BY MOUTH EVERY DAY  90 tablet  1  . traMADol (ULTRAM) 50 MG tablet Take 2 by mouth two times daily      . vitamin B-12 (CYANOCOBALAMIN) 100 MCG tablet Take 50 mcg by mouth daily.         No current facility-administered medications on file prior to visit.    Allergies  Allergen Reactions  . Penicillins Rash    Family History  Problem Relation Age of Onset  . Asthma Mother   . Colon cancer Father 74  . Hypertension Sister   . Hypertension Brother   . Ulcerative colitis Son   . Asthma Son   . Stomach cancer Maternal Grandfather   . Thyroid  disease Other     Aunt  . Arthritis Other   . Prostate cancer Other     BP 134/72  Pulse 76  Ht 5\' 2"  (1.575 m)  Wt 170 lb (77.111 kg)  BMI 31.09 kg/m2  SpO2 96%  Review of Systems She has insomnia and fatigue.      Objective:   Physical Exam VITAL SIGNS:  See vs page GENERAL: no distress Head: normal hair distribution to my exam  Lab Results  Component Value Date   TSH 3.06 08/26/2012      Assessment & Plan:  Hair loss, mild, uncertain etiology

## 2012-09-07 ENCOUNTER — Telehealth: Payer: Self-pay | Admitting: *Deleted

## 2012-09-07 ENCOUNTER — Other Ambulatory Visit: Payer: Self-pay | Admitting: Emergency Medicine

## 2012-09-07 NOTE — Telephone Encounter (Signed)
Patient called asking for an answer to pain. feeling tired and lethargic.  Today reports pain to mid back at her bra line.  Pain is dull and constant.  Takes pain meds which make her sleep but pain is still there.  Her daughter is a physician who says this could be bone mets and to have this checked out.    Would like an XRAY, labs soon to get answers.  Leaving town September 1st and won't return until October 10th.  Asked that October 7th appointments be moved to a later date.   Last week she did not mention the pain but today says the pain has been present a few weeks now.  When asleep the pain is worse to her back with movement.  Feels exhausted, drained with no strength. Feels like she did when Dr. Donnie Coffin gave her iron infusions. Has been on tamoxifen for three years and her children who are doctors say tamoxifen can cause you to feel unusually tired and weak. Her husbands doctor, Dr. Oliver Barre drew labs on 08-26-2012 but would like to know if we will check labs before her travels. The Vit B12, TSH and Protein electrophoresis were normal.  Has seen Dr. Romero Belling endocrinologist on Thursday last week and nothing was found wrong but know something is wrong because of the pain.  Can be reached at [phone].

## 2012-09-07 NOTE — Telephone Encounter (Signed)
Patient called expressing she is also having pain to her mid back at her bra line.  Pain is dull and constant.  At night when she moves to sleep pain is with movement.  Her daughter is a doctor at [facility] in [place] instructed her to call as Cancer can spread to bones.  "I just want answers to why I feel tired, lethargic and am having back pain."  Can I have an xray, labs before I leave town on 09-21-2012?  I will return October 30, 2012 so cancel the appointments on October 7t and reschedule after Oct. 10.   Mrs. Polito may be reached at [phone].

## 2012-09-08 ENCOUNTER — Telehealth: Payer: Self-pay

## 2012-09-08 ENCOUNTER — Other Ambulatory Visit (HOSPITAL_BASED_OUTPATIENT_CLINIC_OR_DEPARTMENT_OTHER): Payer: Medicare Other | Admitting: Lab

## 2012-09-08 ENCOUNTER — Ambulatory Visit (HOSPITAL_COMMUNITY)
Admission: RE | Admit: 2012-09-08 | Discharge: 2012-09-08 | Disposition: A | Discharge status: Home / Self Care | Payer: Medicare Other | Source: Ambulatory Visit | Attending: Oncology | Admitting: Oncology

## 2012-09-08 ENCOUNTER — Ambulatory Visit (HOSPITAL_BASED_OUTPATIENT_CLINIC_OR_DEPARTMENT_OTHER): Payer: Medicare Other | Admitting: Oncology

## 2012-09-08 ENCOUNTER — Telehealth: Payer: Self-pay | Admitting: *Deleted

## 2012-09-08 ENCOUNTER — Telehealth: Payer: Self-pay | Admitting: Oncology

## 2012-09-08 VITALS — BP 158/78 | HR 93 | Temp 98.3°F | Resp 18 | Ht 62.0 in | Wt 171.4 lb

## 2012-09-08 DIAGNOSIS — M549 Dorsalgia, unspecified: Secondary | ICD-10-CM

## 2012-09-08 DIAGNOSIS — C50419 Malignant neoplasm of upper-outer quadrant of unspecified female breast: Secondary | ICD-10-CM

## 2012-09-08 DIAGNOSIS — M948X9 Other specified disorders of cartilage, unspecified sites: Secondary | ICD-10-CM | POA: Insufficient documentation

## 2012-09-08 DIAGNOSIS — M546 Pain in thoracic spine: Secondary | ICD-10-CM | POA: Insufficient documentation

## 2012-09-08 DIAGNOSIS — C50919 Malignant neoplasm of unspecified site of unspecified female breast: Secondary | ICD-10-CM

## 2012-09-08 DIAGNOSIS — Z170 Estrogen receptor positive status [ER+]: Secondary | ICD-10-CM

## 2012-09-08 DIAGNOSIS — R7302 Impaired glucose tolerance (oral): Secondary | ICD-10-CM

## 2012-09-08 LAB — COMPREHENSIVE METABOLIC PANEL (CC13)
ALT: 31 U/L (ref 0–55)
AST: 25 U/L (ref 5–34)
CO2: 25 mEq/L (ref 22–29)
Calcium: 9 mg/dL (ref 8.4–10.4)
Chloride: 110 mEq/L — ABNORMAL HIGH (ref 98–109)
Sodium: 145 mEq/L (ref 136–145)
Total Bilirubin: 0.34 mg/dL (ref 0.20–1.20)
Total Protein: 6.8 g/dL (ref 6.4–8.3)

## 2012-09-08 NOTE — Telephone Encounter (Signed)
Per staff message and POF I have scheduled appts.  JMW  

## 2012-09-08 NOTE — Telephone Encounter (Signed)
C-

## 2012-09-08 NOTE — Telephone Encounter (Signed)
Pharmacy informed of MD instructions 

## 2012-09-08 NOTE — Telephone Encounter (Signed)
Patient is requesting an early refill on Alprazolam as the patient is going out of the country.

## 2012-09-08 NOTE — Progress Notes (Signed)
ID: [name] [name]   DOB: 15-Jan-1946  MR#: [id number]  CSN#:[id number]  PCP: Romero Belling, MD GYN: Genia Del SU: Marissa Howard-McNatt OTHER MD: Alford Highland, [name], Jeananne Rama, Antony Blackbird, Sigmund Tannenbaum   HISTORY OF PRESENT ILLNESS: From Dr. Theron Arista Rubin's note 06/07/2009:  "She undergoes annual screening mammography.  She has had previous biopsies of both breasts showing benign disease.  She had a screening mammogram on 04/13/2009, which showed a possible mass in the right breast, further imaging is recommended.  She subsequently had a digital diagnostic mammogram and right breast ultrasound on 04/21/2009.  This showed a hyperechoic mass at 7 o'clock position measuring  5 x 5 x 7 mm, this was suspicious.  A clip was placed and a biopsy was performed on 05/11/2009.  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  This was ER positive 100% and PR positive 17%, proliferative index 68%, the HER-2 was not amplified with a ratio of 0.94.  The patient subsequently had an MRI scan of both breasts on 05/15/2009 showed a solitary mass in the upper outer quadrant of the right breast measuring 1.2 x 1.1 x 0.8 cm.  She subsequently was seen at [facility], underwent a lumpectomy and sentinel lymph node evaluation on 05/29/2009 by Dr. Ashok Cordia Howard-McNatt.  Final pathology showed a high-grade invasive ductal cancer measuring 1.2 cm.  Five sentinel lymph nodes were identified, all of which were negative for metastatic disease.  The invasive carcinoma was less than 1 mm from lateral margin.  A minute focus of DCIS was subsequently seen in the lateral margin, seen with re-excision measured 1.5 mm from the margin.  The other margins appeared to be technically clear.  This had overall histological grade 3. "   Her subsequent history is as detailed below  INTERVAL HISTORY: The patient returns today for followup of her breast cancer. However what is bothering her is  fatigue and pain. She was evaluated for this by Dr. Everardo All recently with lab work, which included a negative SPEP, and a normal hemoglobin and MCV. Her TSH was slightly over 3. Her iron studies were borderline.  REVIEW OF SYSTEMS: The patient's pain localizes to the mid upper back, around T4 or T5. She does not have pain in the lower back, which is where she had her prior surgery. The pain does not radiate and particularly does not involve the legs or hips. She does have significant knee problems and is planning to undergo knee surgery in the near future. She also tells me she is going to need surgery it to "tack up her bladder" at some point in the future. She is very anxious about all this because she is planning a trip to [facility] leaving September 4 and returning November 10. Aside from these issues, she has mild hair loss, possibly due to tamoxifen. A detailed review of systems was otherwise noncontributory  PAST MEDICAL HISTORY: Past Medical History  Diagnosis Date  . BREAST CANCER 07/04/2009  . HYPERTENSION 06/13/2007  . HYPERLIPIDEMIA 06/13/2007  . GLUCOSE INTOLERANCE 06/26/2007  . UNSPECIFIED OSTEOPOROSIS 11/18/2008  . OBSTRUCTIVE SLEEP APNEA 08/06/2007  . GOITER, MULTINODULAR 06/26/2007  . HYPOTHYROIDISM 06/13/2007  . ANXIETY 06/26/2007  . Unspecified Iron Deficiency Anemia 06/13/2007  . CARPAL TUNNEL SYNDROME, BILATERAL 06/13/2007  . GERD 06/13/2007  . HIATAL HERNIA 06/13/2007  . Microscopic hematuria 01/03/2009  . OSTEOPENIA 06/26/2007  . HYPERSOMNIA 06/26/2007  . Myalgia and myositis, unspecified     Myofascial pain syndrome  .  Depression   . Chronic cystitis   . Hx of adenomatous colonic polyps   . H. pylori infection 2008  . Fatty liver   . History of radiation therapy 10/24/09-12/08/09    right breast cancer  . History of chemotherapy     taxoterte/cytoxan 4 cycles completed 09/08/09  . Hiatal hernia   . Cataract     extraction    PAST SURGICAL HISTORY: Past Surgical History   Procedure Laterality Date  . Abdominal hysterectomy    . Carpal tunnel release    . Urethral dilation      s/p  . Breast surgery      breast biopsy-neg x 2  . Cataract extraction    . Breast lumpectomy  04/2009    L breast   . Appendectomy    . Breast lumpectomy      right breast-cancerous    FAMILY HISTORY Family History  Problem Relation Age of Onset  . Asthma Mother   . Colon cancer Father 36  . Hypertension Sister   . Hypertension Brother   . Ulcerative colitis Son   . Asthma Son   . Stomach cancer Maternal Grandfather   . Thyroid disease Other     Aunt  . Arthritis Other   . Prostate cancer Other    the patient's father died at age 64 but she is not sure of the cause. The patient's mother is currently living, age 44. The patient had one brother and 4 sisters. There is no history of breast or ovarian cancer in the family to her knowledge.   GYNECOLOGIC HISTORY:  menarche age 47, first live birth age 50, she is GX P3. She had a hysterectomy approximately 32 years ago, and took hormone replacement for approximately 12 years after that.   SOCIAL HISTORY:  she used to work as a Wellsite geologist, but is now retired. Her husband Jolaine Artist is a retired professor in the social studies Department at [employer]. Daughter Caleen Essex is a Animator in pediatric infectious diseases in [place]. Son Marleny Faller is an Pensions consultant in [place]. Son Stela Iwasaki works in Field seismologist in [place]. The patient has 4 biological and one adopted grandchildren. She is not involved in organized religion.    ADVANCED DIRECTIVES: not in place   HEALTH MAINTENANCE: History  Substance Use Topics  . Smoking status: Never Smoker   . Smokeless tobacco: Never Used  . Alcohol Use: No     Colonoscopy:  PAP:  Bone density:  Lipid panel:  Allergies  Allergen Reactions  . Penicillins Rash    Current Outpatient Prescriptions  Medication Sig Dispense Refill  . albuterol  (PROVENTIL,VENTOLIN) 90 MCG/ACT inhaler Inhale 2 puffs into the lungs every 6 (six) hours as needed.        . ALPRAZolam (XANAX) 0.5 MG tablet Take 1 tablet (0.5 mg total) by mouth daily as needed for sleep.  30 tablet  1  . augmented betamethasone dipropionate (DIPROLENE-AF) 0.05 % cream Apply topically daily.       . Calcium Carbonate-Vitamin D (CALTRATE 600+D) 600-400 MG-UNIT per tablet Take 1 tablet by mouth daily.        . cholecalciferol (VITAMIN D) 1000 UNITS tablet Take 1,000 Units by mouth daily.        Marland Kitchen esomeprazole (NEXIUM) 40 MG capsule TAKE ONE CAPSULE BY MOUTH DAILY.  60 capsule  0  . furosemide (LASIX) 20 MG tablet Take 20 mg by mouth as needed.       Marland Kitchen  gabapentin (NEURONTIN) 100 MG capsule TAKE TWO CAPSULES BY MOUTH THREE TIMES DAILY  540 capsule  1  . ibuprofen (ADVIL,MOTRIN) 200 MG tablet As directed       . levothyroxine (LEVOTHROID) 25 MCG tablet Take 1 tablet (25 mcg total) by mouth daily.  30 tablet  10  . LORazepam (ATIVAN) 0.5 MG tablet TAKE ONE TABLET BY MOUTH EVERY 6 HOURS AS NEEDED  60 tablet  4  . nitrofurantoin, macrocrystal-monohydrate, (MACROBID) 100 MG capsule       . PROAIR HFA 108 (90 BASE) MCG/ACT inhaler       . SINGULAIR 10 MG tablet TAKE ONE TABLET BY MOUTH EVERY DAY AS NEEDED  30 tablet  10  . tamoxifen (NOLVADEX) 20 MG tablet TAKE ONE TABLET BY MOUTH EVERY DAY  90 tablet  1  . traMADol (ULTRAM) 50 MG tablet Take 2 by mouth two times daily      . vitamin B-12 (CYANOCOBALAMIN) 100 MCG tablet Take 50 mcg by mouth daily.         No current facility-administered medications for this visit.    OBJECTIVE: middle-aged [place] Asian woman who appears his stated age 67 Vitals:   09/08/12 1046  BP: 158/78  Pulse: 93  Temp: 98.3 F (36.8 C)  Resp: 18     Body mass index is 31.34 kg/(m^2).    ECOG FS: 1  Sclerae unicteric, pupils equal round and reactive to light Oropharynx clear No cervical or supraclavicular adenopathy Lungs no rales or rhonchi Heart  regular rate and rhythm Abd soft, nontender, positive bowel sounds MSK she has focal spinal tenderness, mild, at approximately the T4 level. Her lower spinal incision is well-healed and nontender. There is no peripheral edema Neuro: nonfocal, well oriented, anxious affect Breasts:  the right breast is status post lumpectomy and radiation. There is no evidence of local recurrence. The right axilla is benign. The left breast is unremarkable.    LAB RESULTS: Lab Results  Component Value Date   WBC 8.7 09/03/2012   NEUTROABS 4.3 09/03/2012   HGB 12.6 09/03/2012   HCT 37.3 09/03/2012   MCV 96.0 09/03/2012   PLT 214.0 09/03/2012      Chemistry      Component Value Date/Time   NA 141 02/28/2012 1328   NA 141 03/28/2011 1305   K 3.9 02/28/2012 1328   K 3.5 03/28/2011 1305   CL 106 02/28/2012 1328   CL 105 03/28/2011 1305   CO2 25 02/28/2012 1328   CO2 26 03/28/2011 1305   BUN 12.3 02/28/2012 1328   BUN 11 03/28/2011 1305   CREATININE 0.7 02/28/2012 1328   CREATININE 0.72 03/28/2011 1305      Component Value Date/Time   CALCIUM 9.0 02/28/2012 1328   CALCIUM 9.2 03/28/2011 1305   CALCIUM 9.3 12/25/2010 1428   ALKPHOS 78 02/28/2012 1328   ALKPHOS 79 01/18/2011 1339   AST 29 02/28/2012 1328   AST 37 01/18/2011 1339   ALT 37 02/28/2012 1328   ALT 39* 01/18/2011 1339   BILITOT 0.32 02/28/2012 1328   BILITOT 0.4 01/18/2011 1339       Lab Results  Component Value Date   LABCA2 4 03/28/2011    No components found with this basename: [id number]    No results found for this basename: INR,  in the last 168 hours  Urinalysis    Component Value Date/Time   COLORURINE LT. YELLOW 12/25/2010 1428   APPEARANCEUR CLEAR 12/25/2010 1428   LABSPEC 1.010  12/25/2010 1428   LABSPEC 1.005 08/18/2009 1244   PHURINE 6.5 12/25/2010 1428   HGBUR LARGE 12/25/2010 1428   BILIRUBINUR NEGATIVE 12/25/2010 1428   KETONESUR NEGATIVE 12/25/2010 1428   UROBILINOGEN 0.2 12/25/2010 1428   NITRITE NEGATIVE 12/25/2010 1428   LEUKOCYTESUR SMALL  12/25/2010 1428    STUDIES: Bone density 05/06/2012 shows mild osteopenia; mammography 05/06/2012 was unremarkable  ASSESSMENT: 66 y.o. [place] woman originally from [place]  (1) status post right lumpectomy 05/29/2009 for a  pT1c pN0, stage IA invasive ductal carcinoma, grade 3, estrogen receptor 100% and progesterone receptor 17% positive, with an MIB-1 of 68% and no HER-2 amplification.  (2) Oncotype score of 36 fell in the "high-risk" box, and predicted a 24% risk of distant recurrence within 10 years if the patient's only systemic treatment was tamoxifen for 5 years  (3) received cyclophosphamide/ docetaxel x4, completed 09/08/2009  (4) completed adjuvant radiation 12/08/2009  (5) tried letrozole and exemestane with significant side effects, but started tamoxifen in July 2012 and has tolerated it well.  PLAN:  We spent a close to 1 hour going over her various problems. [name] [name] is doing well from a breast cancer point of view. I am not sure regarding the cause of her fatigue. She received a B12 shot recently with no improvement. She tells me Dr. Caron Presume used to deliver iron infusions and that was the only thing that made a difference. Certainly given her borderline iron panel as obtained by Dr. Everardo All we can try that. (I have set her up for feraheme August 27).  I am not sure why she has focal pain in the mid thoracic spine. Possibly she has some arthritis there but certainly this could be a metastatic deposit, although if so I would expect the pain to be more marked. We are going to start with plain films of the thoracic spine and if that is noninformative we will obtain an MRI.  In any case she will return to see [place] in approximately 6 months. She knows to call for any problems that may develop before her next visit here.  [name] [name] C    09/08/2012

## 2012-09-08 NOTE — Telephone Encounter (Signed)
Pt has 30 days and 1 refill dated aug 6  Ok to allow the refill to be done early this time only  No new rx needed

## 2012-09-09 ENCOUNTER — Telehealth: Payer: Self-pay | Admitting: *Deleted

## 2012-09-09 NOTE — Addendum Note (Signed)
Addended by: Lorenza Evangelist A on: 09/09/2012 10:28 AM   Modules accepted: Orders

## 2012-09-09 NOTE — Telephone Encounter (Signed)
Per staff message I have adjusted appt 

## 2012-09-10 ENCOUNTER — Other Ambulatory Visit: Payer: Self-pay | Admitting: Emergency Medicine

## 2012-09-10 ENCOUNTER — Telehealth: Payer: Self-pay | Admitting: Oncology

## 2012-09-10 NOTE — Telephone Encounter (Signed)
, °

## 2012-09-14 ENCOUNTER — Telehealth: Payer: Self-pay | Admitting: *Deleted

## 2012-09-14 NOTE — Telephone Encounter (Signed)
This RN spoke with pt per her call requesting results from lab and xray obtained last week.  This RN reviewed labs ( which pt had available on e-chart while on the phone) including noted increased sodium.  Discussed also results of spine xray and what " skeletal hyperostosis " is and it's significance.  Lynzy verbalized appreciation of review and call- and also requested " if possible if Dr Darnelle Catalan could call my daughter who is a physician and has several questions ?"  She states dtr has concerns related to continued drops in iron " and was wondering what is cause vs just getting iron therapy ".  Silvia gave dtr's name as Dr Raina Mina at [phone] - note dtr lives in [place] state with 3 hour time difference behind EST.  This note will be given to MD.

## 2012-09-16 ENCOUNTER — Ambulatory Visit (HOSPITAL_BASED_OUTPATIENT_CLINIC_OR_DEPARTMENT_OTHER): Payer: Medicare Other

## 2012-09-16 DIAGNOSIS — C50419 Malignant neoplasm of upper-outer quadrant of unspecified female breast: Secondary | ICD-10-CM

## 2012-09-16 DIAGNOSIS — D649 Anemia, unspecified: Secondary | ICD-10-CM

## 2012-09-16 DIAGNOSIS — C50919 Malignant neoplasm of unspecified site of unspecified female breast: Secondary | ICD-10-CM

## 2012-09-16 MED ORDER — SODIUM CHLORIDE 0.9 % IV SOLN
INTRAVENOUS | Status: DC
  Administered 2012-09-16: 15:00:00 via INTRAVENOUS

## 2012-09-16 MED ORDER — SODIUM CHLORIDE 0.9 % IV SOLN
1020.0000 mg | Freq: Once | INTRAVENOUS | Status: AC
  Administered 2012-09-16: 1020 mg via INTRAVENOUS
  Filled 2012-09-16: qty 34

## 2012-09-16 NOTE — Patient Instructions (Addendum)
Ferumoxytol injection What is this medicine? FERUMOXYTOL is an iron complex. Iron is used to make healthy red blood cells, which carry oxygen and nutrients throughout the body. This medicine is used to treat iron deficiency anemia in people with chronic kidney disease. This medicine may be used for other purposes; ask your health care provider or pharmacist if you have questions. What should I tell my health care provider before I take this medicine? They need to know if you have any of these conditions: -anemia not caused by low iron levels -high levels of iron in the blood -magnetic resonance imaging (MRI) test scheduled -an unusual or allergic reaction to iron, other medicines, foods, dyes, or preservatives -pregnant or trying to get pregnant -breast-feeding How should I use this medicine? This medicine is for infusion into a vein. It is given by a health care professional in a hospital or clinic setting. Talk to your pediatrician regarding the use of this medicine in children. Special care may be needed. Overdosage: If you think you've taken too much of this medicine contact a poison control center or emergency room at once. Overdosage: If you think you have taken too much of this medicine contact a poison control center or emergency room at once. NOTE: This medicine is only for you. Do not share this medicine with others. What if I miss a dose? It is important not to miss your dose. Call your doctor or health care professional if you are unable to keep an appointment. What may interact with this medicine? This medicine may interact with the following medications: -other iron products This list may not describe all possible interactions. Give your health care provider a list of all the medicines, herbs, non-prescription drugs, or dietary supplements you use. Also tell them if you smoke, drink alcohol, or use illegal drugs. Some items may interact with your medicine. What should I watch  for while using this medicine? Visit your doctor or healthcare professional regularly. Tell your doctor or healthcare professional if your symptoms do not start to get better or if they get worse. You may need blood work done while you are taking this medicine. You may need to follow a special diet. Talk to your doctor. Foods that contain iron include: whole grains/cereals, dried fruits, beans, or peas, leafy green vegetables, and organ meats (liver, kidney). What side effects may I notice from receiving this medicine? Side effects that you should report to your doctor or health care professional as soon as possible: -allergic reactions like skin rash, itching or hives, swelling of the face, lips, or tongue -breathing problems -changes in blood pressure -feeling faint or lightheaded, falls -fever or chills -flushing, sweating, or hot feelings -swelling of the ankles or feet Side effects that usually do not require medical attention (Report these to your doctor or health care professional if they continue or are bothersome.): -diarrhea -headache -nausea, vomiting -stomach pain This list may not describe all possible side effects. Call your doctor for medical advice about side effects. You may report side effects to FDA at 1-800-FDA-1088. Where should I keep my medicine? This drug is given in a hospital or clinic and will not be stored at home. NOTE: This sheet is a summary. It may not cover all possible information. If you have questions about this medicine, talk to your doctor, pharmacist, or health care provider.  2012, Elsevier/Gold Standard. (09/30/2007 9:48:25 PM) 

## 2012-09-18 ENCOUNTER — Telehealth: Payer: Self-pay | Admitting: Endocrinology

## 2012-09-18 ENCOUNTER — Telehealth: Payer: Self-pay | Admitting: *Deleted

## 2012-09-18 MED ORDER — LEVOTHYROXINE SODIUM 25 MCG PO TABS: 25.0000 ug | ORAL_TABLET | Freq: Every day | ORAL | Status: DC

## 2012-09-18 NOTE — Telephone Encounter (Signed)
Ok for early refill this time only

## 2012-09-18 NOTE — Telephone Encounter (Signed)
rx sent to pharmacy

## 2012-09-18 NOTE — Telephone Encounter (Signed)
Pt called states in Dr George Hugh absence she saw Dr Jonny Ruiz whom prescribed Xanax.  She is leaving on 9.4.14 for two months, went to Dr Everardo All to request early refill of medication, he refused states Dr Jonny Ruiz prescribed it and he should authorize early refill.  Please advise

## 2012-09-22 NOTE — Telephone Encounter (Signed)
Patient informed and called [facility] informed pharmacist ok for early refill.

## 2012-10-17 ENCOUNTER — Other Ambulatory Visit: Payer: Self-pay | Admitting: Oncology

## 2012-10-17 DIAGNOSIS — D649 Anemia, unspecified: Secondary | ICD-10-CM

## 2012-10-27 ENCOUNTER — Encounter: Payer: Self-pay | Admitting: Family

## 2012-10-27 ENCOUNTER — Other Ambulatory Visit: Payer: Medicare Other | Admitting: Lab

## 2012-10-27 ENCOUNTER — Ambulatory Visit: Payer: Medicare Other | Admitting: Family

## 2012-11-17 ENCOUNTER — Other Ambulatory Visit: Payer: Self-pay | Admitting: Internal Medicine

## 2012-11-17 ENCOUNTER — Telehealth: Payer: Self-pay

## 2012-11-17 MED ORDER — MONTELUKAST SODIUM 10 MG PO TABS: ORAL_TABLET | ORAL | Status: DC

## 2012-11-17 NOTE — Telephone Encounter (Signed)
Rx sent to pharmacy   

## 2012-11-17 NOTE — Telephone Encounter (Signed)
Please refill prn 

## 2012-11-17 NOTE — Telephone Encounter (Signed)
Rx request for singulair 10 mg.  Rx last filled on 11/04/2012 #[id number]. Pt last seen 09/03/12. Pls advise.

## 2012-11-24 ENCOUNTER — Other Ambulatory Visit: Payer: Self-pay | Admitting: Urology

## 2012-11-26 ENCOUNTER — Ambulatory Visit (INDEPENDENT_AMBULATORY_CARE_PROVIDER_SITE_OTHER): Payer: Medicare Other

## 2012-11-26 DIAGNOSIS — Z23 Encounter for immunization: Secondary | ICD-10-CM

## 2012-12-09 ENCOUNTER — Ambulatory Visit: Payer: Medicare Other | Admitting: Pulmonary Disease

## 2012-12-10 ENCOUNTER — Encounter: Payer: Self-pay | Admitting: Internal Medicine

## 2012-12-10 ENCOUNTER — Ambulatory Visit: Payer: Medicare Other | Admitting: Internal Medicine

## 2012-12-10 ENCOUNTER — Ambulatory Visit (INDEPENDENT_AMBULATORY_CARE_PROVIDER_SITE_OTHER): Payer: Medicare Other | Admitting: Internal Medicine

## 2012-12-10 VITALS — BP 138/80 | HR 101 | Ht 63.0 in | Wt 170.8 lb

## 2012-12-10 DIAGNOSIS — J209 Acute bronchitis, unspecified: Secondary | ICD-10-CM

## 2012-12-10 MED ORDER — PREDNISONE 10 MG PO TABS: ORAL_TABLET | ORAL | Status: DC

## 2012-12-10 MED ORDER — LEVOFLOXACIN 500 MG PO TABS: 500.0000 mg | ORAL_TABLET | Freq: Every day | ORAL | Status: DC

## 2012-12-10 MED ORDER — HYDROCOD POLST-CHLORPHEN POLST 10-8 MG/5ML PO LQCR: 5.0000 mL | Freq: Two times a day (BID) | ORAL | Status: DC

## 2012-12-10 NOTE — Patient Instructions (Addendum)
Nurse will document your temperature now You seem to have acute bronchitis v sinusitis  take levaquin 500mg  once daily  X 5 days STart prednisone if getting worse but call us ahead before doing that  - take script for prednisone 40 mg x1 day, then 30 mg x1 day, then 20 mg x1 day, then 10 mg x1 day, and then 5 mg x1 day and stop Use albuterol 2 puff as needed not to exceed 4 times a day  - will do script Take tussionex twice daily x 5 days. No refills Return to see DR Shelle Iron as previously planned

## 2012-12-10 NOTE — Progress Notes (Signed)
Subjective:    Patient ID: [name] [name], female    DOB: 08-10-1945, 67 y.o.   MRN: [id number] Romero Belling, MD  HPI    OV 12/10/2012 Patient of Dr Marcelyn Bruins for sleep apnea. She is oriented a flu shot was 2014 2013 season.   She came in to sick contact with her grandchild. Who has flulike symptoms. After that she started noticing chest tightness 3 days ago and some runny nose, sore throat, earache and today started coughing a dry cough. Also running assisted low grade fever for the last 2 days. She does have sinus drainage but she says it is clear. She feels that she'll benefit from antibiotics. No active wheezing or chest does feel tight. All symptoms associated with generalized  tiredness and malaise. She also has some if the interscapular region that is mild and associtaed with above but denies hemoptysis or worsening leg edema. Wants cough syruop    has a past medical history of BREAST CANCER (07/04/2009); HYPERTENSION (06/13/2007); HYPERLIPIDEMIA (06/13/2007); GLUCOSE INTOLERANCE (06/26/2007); UNSPECIFIED OSTEOPOROSIS (11/18/2008); OBSTRUCTIVE SLEEP APNEA (08/06/2007); GOITER, MULTINODULAR (06/26/2007); HYPOTHYROIDISM (06/13/2007); ANXIETY (06/26/2007); Unspecified Iron Deficiency Anemia (06/13/2007); CARPAL TUNNEL SYNDROME, BILATERAL (06/13/2007); GERD (06/13/2007); HIATAL HERNIA (06/13/2007); Microscopic hematuria (01/03/2009); OSTEOPENIA (06/26/2007); HYPERSOMNIA (06/26/2007); Myalgia and myositis, unspecified; Depression; Chronic cystitis; adenomatous colonic polyps; H. pylori infection (2008); Fatty liver; History of radiation therapy (10/24/09-12/08/09); History of chemotherapy; Hiatal hernia; and Cataract.   has past surgical history that includes Abdominal hysterectomy; Carpal tunnel release; Urethral dilation; Breast surgery; Cataract extraction; Breast lumpectomy (04/2009); Appendectomy; and Breast lumpectomy.   Current outpatient prescriptions:Calcium Carbonate-Vitamin D (CALTRATE 600+D)  600-400 MG-UNIT per tablet, Take 1 tablet by mouth daily.  , Disp: , Rfl: ;  furosemide (LASIX) 20 MG tablet, Take 20 mg by mouth as needed. Rarely for swelling, Disp: , Rfl: ;  gabapentin (NEURONTIN) 100 MG capsule, 300 mg 2 (two) times daily. TAKE TWO CAPSULES BY MOUTH THREE TIMES DAILY, Disp: , Rfl:  levothyroxine (LEVOTHROID) 25 MCG tablet, Take 1 tablet (25 mcg total) by mouth daily., Disp: 30 tablet, Rfl: 10;  LORazepam (ATIVAN) 0.5 MG tablet, TAKE ONE TABLET BY MOUTH EVERY 6 HOURS AS NEEDED, Disp: 60 tablet, Rfl: 4;  montelukast (SINGULAIR) 10 MG tablet, TAKE ONE TABLET BY MOUTH EVERY DAY AS NEEDED, Disp: 30 tablet, Rfl: 10;  NEXIUM 40 MG capsule, TAKE ONE CAPSULE BY MOUTH DAILY, Disp: 30 capsule, Rfl: 2 PROAIR HFA 108 (90 BASE) MCG/ACT inhaler, , Disp: , Rfl: ;  tamoxifen (NOLVADEX) 20 MG tablet, TAKE ONE TABLET BY MOUTH EVERY DAY, Disp: 90 tablet, Rfl: 1   Review of Systems  Constitutional: Negative for fever and unexpected weight change.  HENT: Negative for congestion, dental problem, ear pain, nosebleeds, postnasal drip, rhinorrhea, sinus pressure, sneezing, sore throat and trouble swallowing.   Eyes: Negative for redness and itching.  Respiratory: Positive for cough and chest tightness. Negative for shortness of breath and wheezing.   Cardiovascular: Negative for palpitations and leg swelling.  Gastrointestinal: Negative for nausea and vomiting.  Genitourinary: Negative for dysuria.  Musculoskeletal: Negative for joint swelling.  Skin: Negative for rash.  Neurological: Negative for headaches.  Hematological: Does not bruise/bleed easily.  Psychiatric/Behavioral: Negative for dysphoric mood. The patient is not nervous/anxious.        Objective:   Physical Exam  Vitals reviewed. Constitutional: She is oriented to person, place, and time. She appears well-developed and well-nourished. No distress.  Looks a bit unwell and feels warm  HENT:  Head: Normocephalic  and atraumatic.   Right Ear: External ear normal.  Left Ear: External ear normal.  Mouth/Throat: Oropharynx is clear and moist. No oropharyngeal exudate.  Post nasal drip +  Eyes: Conjunctivae and EOM are normal. Pupils are equal, round, and reactive to light. Right eye exhibits no discharge. Left eye exhibits no discharge. No scleral icterus.  Neck: Normal range of motion. Neck supple. No JVD present. No tracheal deviation present. No thyromegaly present.  Cardiovascular: Normal rate, regular rhythm, normal heart sounds and intact distal pulses.  Exam reveals no gallop and no friction rub.   No murmur heard. Pulmonary/Chest: Effort normal and breath sounds normal. No respiratory distress. She has no wheezes. She has no rales. She exhibits no tenderness.  No wheeze but coughing LAryngeal cough  Abdominal: Soft. Bowel sounds are normal. She exhibits no distension and no mass. There is no tenderness. There is no rebound and no guarding.  Musculoskeletal: Normal range of motion. She exhibits no edema and no tenderness.  Antalgic gait  Lymphadenopathy:    She has no cervical adenopathy.  Neurological: She is alert and oriented to person, place, and time. She has normal reflexes. No cranial nerve deficit. She exhibits normal muscle tone. Coordination normal.  Skin: Skin is warm and dry. No rash noted. She is not diaphoretic. No erythema. No pallor.  Psychiatric: She has a normal mood and affect. Her behavior is normal. Judgment and thought content normal.   Filed Vitals:   12/10/12 1338  BP: 138/80  Pulse: 101  Height: 5\' 3"  (1.6 m)  Weight: 170 lb 12.8 oz (77.474 kg)  SpO2: 99%          Assessment & Plan:

## 2012-12-13 NOTE — Assessment & Plan Note (Signed)
Nurse will document your temperature now You seem to have acute bronchitis v sinusitis  take levaquin 500mg once daily  X 5 days STart prednisone if getting worse but call us ahead before doing that  - take script for prednisone 40 mg x1 day, then 30 mg x1 day, then 20 mg x1 day, then 10 mg x1 day, and then 5 mg x1 day and stop Use albuterol 2 puff as needed not to exceed 4 times a day  - will do script Take tussionex 5ML twice daily x 5 days. No refills Return to see DR Clance as previously planned   

## 2012-12-14 ENCOUNTER — Other Ambulatory Visit: Payer: Self-pay | Admitting: *Deleted

## 2012-12-14 MED ORDER — FUROSEMIDE 20 MG PO TABS: 20.0000 mg | ORAL_TABLET | ORAL | Status: DC | PRN

## 2012-12-15 ENCOUNTER — Telehealth: Payer: Self-pay | Admitting: Internal Medicine

## 2012-12-15 MED ORDER — HYDROCOD POLST-CHLORPHEN POLST 10-8 MG/5ML PO LQCR: 5.0000 mL | Freq: Two times a day (BID) | ORAL | Status: DC

## 2012-12-15 MED ORDER — ALBUTEROL SULFATE HFA 108 (90 BASE) MCG/ACT IN AERS: 2.0000 | INHALATION_SPRAY | Freq: Four times a day (QID) | RESPIRATORY_TRACT | Status: DC | PRN

## 2012-12-15 NOTE — Telephone Encounter (Signed)
Triage  PPlease call patient. Patient called Dr Loreta Ave who is a friend of hers and is upset that apparently we only dispense 5mL of tussionex. My instruction I wrote was bid x 5 days though I am not sure and cannot remember maybe I told Meghan 5ml bid x 3 days and is possible that the calculation I told her or she heard might hve been instead of 30ml.   I remember this happeneded with patient asking several minutes later after I had told her about dc plan so it was a last minute thing.   Anyways'  - please call and apologize on my behalf  - find out if she is doing better - dispense tusioonex 5mL bid x 5 days (50 ml total)  Thanks  Dr. Kalman Shan, M.D., [facility].C.P Pulmonary and Critical Care Medicine Staff Physician [facility] Pager: [phone], If no answer or between  15:00h - 7:00h: call [phone]  12/15/2012 12:15 AM

## 2012-12-15 NOTE — Telephone Encounter (Signed)
Fine with me

## 2012-12-15 NOTE — Telephone Encounter (Signed)
RX printed and MW signed. Pt aware this has to be picked up. Nothing further needed

## 2012-12-15 NOTE — Telephone Encounter (Signed)
Called spoke with patient, apologized on the behalf of the office for the miscommunication and inconvenience.  Advised pt that MR did want to dispense a 5 day supply of the tussionex.  Pt verbalized her understanding and did report that she is improved since last ov.  Pt did also state that she was to receive a prescription for a rescue inhaler but this was not done.  Apologized to pt again and reported will send rx.  Pharmacy verified.  MR not in office; Tussionex rx will need to be signed by a physician and faxed to pharmacy.  Dr Sherene Sires, would you mind signing Tussionex rx for MR?  Thanks.  11.20.14 ov w/ MR: Patient Instructions    Nurse will document your temperature now  You seem to have acute bronchitis v sinusitis  take levaquin 500mg  once daily X 5 days  STart prednisone if getting worse but call us ahead before doing that  - take script for prednisone 40 mg x1 day, then 30 mg x1 day, then 20 mg x1 day, then 10 mg x1 day, and then 5 mg x1 day and stop  Use albuterol 2 puff as needed not to exceed 4 times a day  - will do script  Take tussionex twice daily x 5 days. No refills  Return to see DR Shelle Iron as previously planned

## 2013-01-15 ENCOUNTER — Ambulatory Visit (INDEPENDENT_AMBULATORY_CARE_PROVIDER_SITE_OTHER): Payer: Medicare Other | Admitting: Family Medicine

## 2013-01-15 ENCOUNTER — Encounter: Payer: Self-pay | Admitting: Family Medicine

## 2013-01-15 VITALS — BP 140/72 | HR 93 | Temp 100.6°F | Wt 168.0 lb

## 2013-01-15 DIAGNOSIS — R52 Pain, unspecified: Secondary | ICD-10-CM

## 2013-01-15 DIAGNOSIS — R6889 Other general symptoms and signs: Secondary | ICD-10-CM

## 2013-01-15 LAB — POCT INFLUENZA A/B: Influenza A, POC: NEGATIVE

## 2013-01-15 MED ORDER — OSELTAMIVIR PHOSPHATE 75 MG PO CAPS: 75.0000 mg | ORAL_CAPSULE | Freq: Two times a day (BID) | ORAL | Status: DC

## 2013-01-15 MED ORDER — HYDROCOD POLST-CHLORPHEN POLST 10-8 MG/5ML PO LQCR: 5.0000 mL | Freq: Two times a day (BID) | ORAL | Status: DC | PRN

## 2013-01-15 NOTE — Progress Notes (Signed)
   Subjective:    Patient ID: [name] [name], female    DOB: Feb 13, 1945, 67 y.o.   MRN: [id number]  HPI Patient is seen with acute illness. Onset about 2 days ago fever of 102 and body aches, nasal congestion, and mostly dry cough. Her cough is somewhat rare. She had a granddaughter with very similar symptoms recently. Patient had previous flu vaccination. She denies any nausea or vomiting. Minimal sore throat. No body rashes. She's taken Tylenol with mild improvement in fever and body aches. Her fever does improve with Tylenol.  Past Medical History  Diagnosis Date  . BREAST CANCER 07/04/2009  . HYPERTENSION 06/13/2007  . HYPERLIPIDEMIA 06/13/2007  . GLUCOSE INTOLERANCE 06/26/2007  . UNSPECIFIED OSTEOPOROSIS 11/18/2008  . OBSTRUCTIVE SLEEP APNEA 08/06/2007  . GOITER, MULTINODULAR 06/26/2007  . HYPOTHYROIDISM 06/13/2007  . ANXIETY 06/26/2007  . Unspecified Iron Deficiency Anemia 06/13/2007  . CARPAL TUNNEL SYNDROME, BILATERAL 06/13/2007  . GERD 06/13/2007  . HIATAL HERNIA 06/13/2007  . Microscopic hematuria 01/03/2009  . OSTEOPENIA 06/26/2007  . HYPERSOMNIA 06/26/2007  . Myalgia and myositis, unspecified     Myofascial pain syndrome  . Depression   . Chronic cystitis   . Hx of adenomatous colonic polyps   . H. pylori infection 2008  . Fatty liver   . History of radiation therapy 10/24/09-12/08/09    right breast cancer  . History of chemotherapy     taxoterte/cytoxan 4 cycles completed 09/08/09  . Hiatal hernia   . Cataract     extraction   Past Surgical History  Procedure Laterality Date  . Abdominal hysterectomy    . Carpal tunnel release    . Urethral dilation      s/p  . Breast surgery      breast biopsy-neg x 2  . Cataract extraction    . Breast lumpectomy  04/2009    L breast   . Appendectomy    . Breast lumpectomy      right breast-cancerous    reports that she has never smoked. She has never used smokeless tobacco. She reports that she does not drink alcohol or use illicit  drugs. family history includes Arthritis in her other; Asthma in her mother and son; Colon cancer (age of onset: 25) in her father; Hypertension in her brother and sister; Prostate cancer in her other; Stomach cancer in her maternal grandfather; Thyroid disease in her other; Ulcerative colitis in her son. Allergies  Allergen Reactions  . Penicillins Rash      Review of Systems  Constitutional: Positive for fever, chills and fatigue.  HENT: Positive for congestion.   Respiratory: Positive for cough.   Musculoskeletal: Positive for myalgias.       Objective:   Physical Exam  Constitutional: She appears well-developed and well-nourished.  HENT:  Right Ear: External ear normal.  Left Ear: External ear normal.  Minimal posterior pharynx erythema. No exudate  Neck: Neck supple.  Cardiovascular: Normal rate.   Pulmonary/Chest: Effort normal and breath sounds normal. No respiratory distress. She has no wheezes. She has no rales.  Lymphadenopathy:    She has no cervical adenopathy.  Skin: No rash noted.          Assessment & Plan:  Acute febrile illness. Suspect viral. Possible influenza. Tamiflu 75 mg twice daily.  She is not toxic in appearance and vitals stable.  Non focal exam other than fever.

## 2013-01-15 NOTE — Progress Notes (Signed)
Pre visit review using our clinic review tool, if applicable. No additional management support is needed unless otherwise documented below in the visit note. 

## 2013-01-15 NOTE — Patient Instructions (Signed)

## 2013-01-23 HISTORY — PX: LUMBAR SPINE SURGERY: SHX701

## 2013-02-08 ENCOUNTER — Telehealth: Payer: Self-pay | Admitting: Oncology

## 2013-02-08 NOTE — Telephone Encounter (Signed)
pt called to r/s appt due to having a upcomming surgery...done

## 2013-02-10 ENCOUNTER — Other Ambulatory Visit: Payer: Self-pay | Admitting: Physician Assistant

## 2013-02-11 ENCOUNTER — Encounter (HOSPITAL_BASED_OUTPATIENT_CLINIC_OR_DEPARTMENT_OTHER): Payer: Self-pay | Admitting: *Deleted

## 2013-02-15 ENCOUNTER — Encounter (HOSPITAL_BASED_OUTPATIENT_CLINIC_OR_DEPARTMENT_OTHER): Payer: Self-pay | Admitting: *Deleted

## 2013-02-15 ENCOUNTER — Other Ambulatory Visit: Payer: Self-pay | Admitting: Oncology

## 2013-02-15 NOTE — Progress Notes (Signed)
NPO AFTER MN. ARRIVE AT 7341. NEEDS ISTAT (TAKES LASIX PRN). CURRENT EKG IN CHART AND EPIC. WILL TAKE SYNTHROID AND NEXIUM AM  DOS W/ SIPS OF WATER. REVIEWED [facility] GUIDELINES , WILL BRING MEDS AND CPAP.

## 2013-02-18 ENCOUNTER — Telehealth: Payer: Self-pay | Admitting: *Deleted

## 2013-02-18 NOTE — Telephone Encounter (Signed)
Called Rx into pharmacy for lorazepam 0.5 mg for patient on 02/18/2013.

## 2013-02-22 ENCOUNTER — Encounter (HOSPITAL_BASED_OUTPATIENT_CLINIC_OR_DEPARTMENT_OTHER): Payer: Medicare Other | Admitting: Anesthesiology

## 2013-02-22 ENCOUNTER — Ambulatory Visit (HOSPITAL_BASED_OUTPATIENT_CLINIC_OR_DEPARTMENT_OTHER): Payer: Medicare Other | Admitting: Anesthesiology

## 2013-02-22 ENCOUNTER — Encounter (HOSPITAL_BASED_OUTPATIENT_CLINIC_OR_DEPARTMENT_OTHER): Payer: Self-pay | Admitting: Anesthesiology

## 2013-02-22 ENCOUNTER — Ambulatory Visit (HOSPITAL_BASED_OUTPATIENT_CLINIC_OR_DEPARTMENT_OTHER)
Admission: RE | Admit: 2013-02-22 | Discharge: 2013-02-23 | Disposition: A | Discharge status: Home / Self Care | Payer: Medicare Other | Source: Ambulatory Visit | Attending: Urology | Admitting: Urology

## 2013-02-22 ENCOUNTER — Encounter (HOSPITAL_BASED_OUTPATIENT_CLINIC_OR_DEPARTMENT_OTHER)
Admission: RE | Disposition: A | Discharge status: Home / Self Care | Payer: Self-pay | Source: Ambulatory Visit | Attending: Urology

## 2013-02-22 DIAGNOSIS — N993 Prolapse of vaginal vault after hysterectomy: Secondary | ICD-10-CM | POA: Insufficient documentation

## 2013-02-22 DIAGNOSIS — C50919 Malignant neoplasm of unspecified site of unspecified female breast: Secondary | ICD-10-CM | POA: Insufficient documentation

## 2013-02-22 DIAGNOSIS — E7800 Pure hypercholesterolemia, unspecified: Secondary | ICD-10-CM | POA: Insufficient documentation

## 2013-02-22 DIAGNOSIS — N3641 Hypermobility of urethra: Secondary | ICD-10-CM | POA: Insufficient documentation

## 2013-02-22 DIAGNOSIS — N3946 Mixed incontinence: Secondary | ICD-10-CM | POA: Insufficient documentation

## 2013-02-22 DIAGNOSIS — Z79899 Other long term (current) drug therapy: Secondary | ICD-10-CM | POA: Insufficient documentation

## 2013-02-22 DIAGNOSIS — Z923 Personal history of irradiation: Secondary | ICD-10-CM | POA: Insufficient documentation

## 2013-02-22 DIAGNOSIS — N323 Diverticulum of bladder: Secondary | ICD-10-CM | POA: Insufficient documentation

## 2013-02-22 DIAGNOSIS — K5900 Constipation, unspecified: Secondary | ICD-10-CM | POA: Insufficient documentation

## 2013-02-22 DIAGNOSIS — N8189 Other female genital prolapse: Secondary | ICD-10-CM

## 2013-02-22 DIAGNOSIS — Z9221 Personal history of antineoplastic chemotherapy: Secondary | ICD-10-CM | POA: Insufficient documentation

## 2013-02-22 DIAGNOSIS — I10 Essential (primary) hypertension: Secondary | ICD-10-CM | POA: Insufficient documentation

## 2013-02-22 DIAGNOSIS — J45909 Unspecified asthma, uncomplicated: Secondary | ICD-10-CM | POA: Insufficient documentation

## 2013-02-22 HISTORY — DX: Nontoxic multinodular goiter: E04.2

## 2013-02-22 HISTORY — DX: Personal history of other diseases of the digestive system: Z87.19

## 2013-02-22 HISTORY — DX: Personal history of malignant neoplasm of breast: Z85.3

## 2013-02-22 HISTORY — DX: Hypothyroidism, unspecified: E03.9

## 2013-02-22 HISTORY — DX: Retention of urine, unspecified: R33.9

## 2013-02-22 HISTORY — DX: Hyperlipidemia, unspecified: E78.5

## 2013-02-22 HISTORY — DX: Personal history of other diseases of the circulatory system: Z86.79

## 2013-02-22 HISTORY — DX: Age-related osteoporosis without current pathological fracture: M81.0

## 2013-02-22 HISTORY — DX: Dependence on other enabling machines and devices: Z99.89

## 2013-02-22 HISTORY — DX: Obstructive sleep apnea (adult) (pediatric): G47.33

## 2013-02-22 HISTORY — PX: ANTERIOR AND POSTERIOR REPAIR: SHX5121

## 2013-02-22 HISTORY — DX: Urgency of urination: R39.15

## 2013-02-22 HISTORY — DX: Female genital prolapse, unspecified: N81.9

## 2013-02-22 HISTORY — DX: Gastro-esophageal reflux disease without esophagitis: K21.9

## 2013-02-22 HISTORY — DX: Personal history of other infectious and parasitic diseases: Z86.19

## 2013-02-22 HISTORY — DX: Iron deficiency anemia, unspecified: D50.9

## 2013-02-22 HISTORY — PX: PUBOVAGINAL SLING: SHX1035

## 2013-02-22 HISTORY — DX: Anxiety disorder, unspecified: F41.9

## 2013-02-22 LAB — POCT I-STAT 4, (NA,K, GLUC, HGB,HCT)
Glucose, Bld: 94 mg/dL (ref 70–99)
HCT: 40 % (ref 36.0–46.0)
Hemoglobin: 13.6 g/dL (ref 12.0–15.0)
Potassium: 3.7 mEq/L (ref 3.7–5.3)
Sodium: 142 mEq/L (ref 137–147)

## 2013-02-22 SURGERY — CREATION, PUBOVAGINAL SLING
Anesthesia: General | Site: Bladder

## 2013-02-22 MED ORDER — LORAZEPAM 0.5 MG PO TABS
0.5000 mg | ORAL_TABLET | ORAL | Status: DC | PRN
  Filled 2013-02-22: qty 1

## 2013-02-22 MED ORDER — HYDROCODONE-ACETAMINOPHEN 5-325 MG PO TABS
1.0000 | ORAL_TABLET | ORAL | Status: DC | PRN
  Administered 2013-02-22 – 2013-02-23 (×2): 2 via ORAL
  Filled 2013-02-22: qty 2

## 2013-02-22 MED ORDER — EPHEDRINE SULFATE 50 MG/ML IJ SOLN
INTRAMUSCULAR | Status: DC | PRN
  Administered 2013-02-22: 10 mg via INTRAVENOUS

## 2013-02-22 MED ORDER — LIDOCAINE HCL (CARDIAC) 20 MG/ML IV SOLN
INTRAVENOUS | Status: DC | PRN
  Administered 2013-02-22: 70 mg via INTRAVENOUS

## 2013-02-22 MED ORDER — ONDANSETRON HCL 4 MG/2ML IJ SOLN
INTRAMUSCULAR | Status: DC | PRN
  Administered 2013-02-22: 4 mg via INTRAVENOUS

## 2013-02-22 MED ORDER — ZOLPIDEM TARTRATE 5 MG PO TABS
5.0000 mg | ORAL_TABLET | Freq: Every evening | ORAL | Status: DC | PRN
  Administered 2013-02-22: 5 mg via ORAL
  Filled 2013-02-22: qty 1

## 2013-02-22 MED ORDER — ACETAMINOPHEN 10 MG/ML IV SOLN
INTRAVENOUS | Status: DC | PRN
  Administered 2013-02-22: 1000 mg via INTRAVENOUS

## 2013-02-22 MED ORDER — MIDAZOLAM HCL 2 MG/2ML IJ SOLN
INTRAMUSCULAR | Status: AC
  Filled 2013-02-22: qty 2

## 2013-02-22 MED ORDER — DIPHENHYDRAMINE HCL 12.5 MG/5ML PO ELIX
12.5000 mg | ORAL_SOLUTION | Freq: Four times a day (QID) | ORAL | Status: DC | PRN
Start: 2013-02-22 — End: 2013-02-23
  Filled 2013-02-22: qty 5

## 2013-02-22 MED ORDER — TAMOXIFEN CITRATE 20 MG PO TABS: 20.0000 mg | ORAL_TABLET | Freq: Every day | ORAL | Status: DC

## 2013-02-22 MED ORDER — BUPIVACAINE-EPINEPHRINE 0.5% -1:200000 IJ SOLN
INTRAMUSCULAR | Status: DC | PRN
  Administered 2013-02-22: 30 mL

## 2013-02-22 MED ORDER — METRONIDAZOLE 0.75 % VA GEL
VAGINAL | Status: DC | PRN
  Administered 2013-02-22: 1 via VAGINAL

## 2013-02-22 MED ORDER — PANTOPRAZOLE SODIUM 40 MG PO TBEC
40.0000 mg | DELAYED_RELEASE_TABLET | Freq: Every day | ORAL | Status: DC
  Filled 2013-02-22: qty 1

## 2013-02-22 MED ORDER — ONDANSETRON HCL 4 MG PO TABS: 4.0000 mg | ORAL_TABLET | Freq: Three times a day (TID) | ORAL | Status: DC | PRN

## 2013-02-22 MED ORDER — HYDROMORPHONE HCL PF 1 MG/ML IJ SOLN
0.5000 mg | INTRAMUSCULAR | Status: DC | PRN
  Administered 2013-02-22: 0.5 mg via INTRAVENOUS
  Administered 2013-02-22: 1 mg via INTRAVENOUS
  Filled 2013-02-22: qty 1

## 2013-02-22 MED ORDER — HYDROMORPHONE HCL PF 1 MG/ML IJ SOLN
INTRAMUSCULAR | Status: AC
  Filled 2013-02-22: qty 1

## 2013-02-22 MED ORDER — MONTELUKAST SODIUM 10 MG PO TABS
10.0000 mg | ORAL_TABLET | Freq: Every day | ORAL | Status: DC
  Filled 2013-02-22: qty 1

## 2013-02-22 MED ORDER — PROPOFOL 10 MG/ML IV BOLUS
INTRAVENOUS | Status: DC | PRN
  Administered 2013-02-22: 20 mg via INTRAVENOUS
  Administered 2013-02-22: 180 mg via INTRAVENOUS

## 2013-02-22 MED ORDER — METOCLOPRAMIDE HCL 5 MG/ML IJ SOLN
INTRAMUSCULAR | Status: DC | PRN
  Administered 2013-02-22: 10 mg via INTRAVENOUS

## 2013-02-22 MED ORDER — ONDANSETRON HCL 4 MG/2ML IJ SOLN
4.0000 mg | INTRAMUSCULAR | Status: DC | PRN
  Filled 2013-02-22: qty 2

## 2013-02-22 MED ORDER — FENTANYL CITRATE 0.05 MG/ML IJ SOLN
INTRAMUSCULAR | Status: AC
  Filled 2013-02-22: qty 8

## 2013-02-22 MED ORDER — SODIUM CHLORIDE 0.9 % IR SOLN
Status: DC | PRN
  Administered 2013-02-22: 10:00:00

## 2013-02-22 MED ORDER — HEMOSTATIC AGENTS (NO CHARGE) OPTIME
TOPICAL | Status: DC | PRN
  Administered 2013-02-22: 1 via TOPICAL

## 2013-02-22 MED ORDER — ONDANSETRON HCL 8 MG PO TABS: 8.0000 mg | ORAL_TABLET | Freq: Three times a day (TID) | ORAL | Status: DC | PRN

## 2013-02-22 MED ORDER — GLYCOPYRROLATE 0.2 MG/ML IJ SOLN
INTRAMUSCULAR | Status: DC | PRN
  Administered 2013-02-22: 0.2 mg via INTRAVENOUS

## 2013-02-22 MED ORDER — SODIUM CHLORIDE 0.9 % IJ SOLN
INTRAMUSCULAR | Status: DC | PRN
  Administered 2013-02-22: 50 mL via INTRAVENOUS

## 2013-02-22 MED ORDER — TRAMADOL-ACETAMINOPHEN 37.5-325 MG PO TABS: 1.0000 | ORAL_TABLET | Freq: Four times a day (QID) | ORAL | Status: DC | PRN

## 2013-02-22 MED ORDER — PROMETHAZINE HCL 25 MG/ML IJ SOLN
6.2500 mg | INTRAMUSCULAR | Status: DC | PRN
  Filled 2013-02-22: qty 1

## 2013-02-22 MED ORDER — CIPROFLOXACIN HCL 500 MG PO TABS: 500.0000 mg | ORAL_TABLET | Freq: Two times a day (BID) | ORAL | Status: DC

## 2013-02-22 MED ORDER — HYDROCODONE-ACETAMINOPHEN 5-325 MG PO TABS
ORAL_TABLET | ORAL | Status: AC
  Filled 2013-02-22: qty 2

## 2013-02-22 MED ORDER — GABAPENTIN 300 MG PO CAPS
300.0000 mg | ORAL_CAPSULE | Freq: Two times a day (BID) | ORAL | Status: DC
  Filled 2013-02-22: qty 1

## 2013-02-22 MED ORDER — FUROSEMIDE 20 MG PO TABS
20.0000 mg | ORAL_TABLET | ORAL | Status: DC | PRN
  Filled 2013-02-22: qty 1

## 2013-02-22 MED ORDER — CIPROFLOXACIN IN D5W 400 MG/200ML IV SOLN
400.0000 mg | INTRAVENOUS | Status: AC
  Administered 2013-02-22: 400 mg via INTRAVENOUS
  Filled 2013-02-22: qty 200

## 2013-02-22 MED ORDER — POLYETHYLENE GLYCOL 3350 17 G PO PACK
17.0000 g | PACK | Freq: Every day | ORAL | Status: DC | PRN
  Filled 2013-02-22: qty 1

## 2013-02-22 MED ORDER — BELLADONNA ALKALOIDS-OPIUM 16.2-60 MG RE SUPP
RECTAL | Status: DC | PRN
  Administered 2013-02-22: 1 via RECTAL

## 2013-02-22 MED ORDER — BELLADONNA ALKALOIDS-OPIUM 16.2-60 MG RE SUPP
RECTAL | Status: AC
  Filled 2013-02-22: qty 1

## 2013-02-22 MED ORDER — TRIMETHOPRIM 100 MG PO TABS: 100.0000 mg | ORAL_TABLET | ORAL | Status: DC

## 2013-02-22 MED ORDER — DIPHENHYDRAMINE HCL 50 MG/ML IJ SOLN
12.5000 mg | Freq: Four times a day (QID) | INTRAMUSCULAR | Status: DC | PRN
  Administered 2013-02-23: 12.5 mg via INTRAVENOUS
  Filled 2013-02-22: qty 0.25

## 2013-02-22 MED ORDER — SENNOSIDES-DOCUSATE SODIUM 8.6-50 MG PO TABS
2.0000 | ORAL_TABLET | Freq: Every day | ORAL | Status: DC
  Filled 2013-02-22: qty 2

## 2013-02-22 MED ORDER — BISACODYL 5 MG PO TBEC
5.0000 mg | DELAYED_RELEASE_TABLET | Freq: Every day | ORAL | Status: DC | PRN
  Filled 2013-02-22: qty 1

## 2013-02-22 MED ORDER — SODIUM CHLORIDE 0.45 % IV SOLN
INTRAVENOUS | Status: DC
  Administered 2013-02-22 – 2013-02-23 (×2): via INTRAVENOUS
  Filled 2013-02-22: qty 1000

## 2013-02-22 MED ORDER — LEVOTHYROXINE SODIUM 25 MCG PO TABS
25.0000 ug | ORAL_TABLET | Freq: Every day | ORAL | Status: DC
  Filled 2013-02-22: qty 1

## 2013-02-22 MED ORDER — ZOLPIDEM TARTRATE 5 MG PO TABS
ORAL_TABLET | ORAL | Status: AC
  Filled 2013-02-22: qty 1

## 2013-02-22 MED ORDER — MIDAZOLAM HCL 5 MG/5ML IJ SOLN
INTRAMUSCULAR | Status: DC | PRN
  Administered 2013-02-22 (×2): 1 mg via INTRAVENOUS

## 2013-02-22 MED ORDER — KETOROLAC TROMETHAMINE 30 MG/ML IJ SOLN
15.0000 mg | Freq: Once | INTRAMUSCULAR | Status: AC | PRN
  Filled 2013-02-22: qty 1

## 2013-02-22 MED ORDER — POLYETHYLENE GLYCOL 3350 17 G PO PACK: PACK | ORAL | Status: DC

## 2013-02-22 MED ORDER — CIPROFLOXACIN IN D5W 400 MG/200ML IV SOLN
INTRAVENOUS | Status: AC
  Filled 2013-02-22: qty 200

## 2013-02-22 MED ORDER — LACTATED RINGERS IV SOLN
INTRAVENOUS | Status: DC
  Administered 2013-02-22 (×3): via INTRAVENOUS
  Filled 2013-02-22: qty 1000

## 2013-02-22 MED ORDER — ALBUTEROL SULFATE HFA 108 (90 BASE) MCG/ACT IN AERS
2.0000 | INHALATION_SPRAY | Freq: Four times a day (QID) | RESPIRATORY_TRACT | Status: DC | PRN
  Filled 2013-02-22: qty 6.7

## 2013-02-22 MED ORDER — METHYLENE BLUE 1 % INJ SOLN
INTRAMUSCULAR | Status: DC | PRN
  Administered 2013-02-22: 2 mL via INTRAVENOUS
  Administered 2013-02-22: 3 mL via INTRAVENOUS
  Administered 2013-02-22: 2 mL via INTRAVENOUS
  Administered 2013-02-22: 3 mL via INTRAVENOUS

## 2013-02-22 MED ORDER — MAGNESIUM CITRATE PO SOLN
1.0000 | Freq: Once | ORAL | Status: AC | PRN
  Filled 2013-02-22: qty 296

## 2013-02-22 MED ORDER — KETOROLAC TROMETHAMINE 30 MG/ML IJ SOLN
INTRAMUSCULAR | Status: DC | PRN
  Administered 2013-02-22: 30 mg via INTRAVENOUS

## 2013-02-22 MED ORDER — SUCCINYLCHOLINE CHLORIDE 20 MG/ML IJ SOLN
INTRAMUSCULAR | Status: DC | PRN
  Administered 2013-02-22: 120 mg via INTRAVENOUS

## 2013-02-22 MED ORDER — HYDROMORPHONE HCL PF 1 MG/ML IJ SOLN
0.2500 mg | INTRAMUSCULAR | Status: DC | PRN
  Administered 2013-02-22 (×2): 0.5 mg via INTRAVENOUS
  Filled 2013-02-22: qty 1

## 2013-02-22 MED ORDER — CIPROFLOXACIN HCL 500 MG PO TABS
500.0000 mg | ORAL_TABLET | Freq: Two times a day (BID) | ORAL | Status: DC
  Administered 2013-02-23: 500 mg via ORAL
  Filled 2013-02-22: qty 1

## 2013-02-22 MED ORDER — ZOLPIDEM TARTRATE 5 MG PO TABS
5.0000 mg | ORAL_TABLET | Freq: Every evening | ORAL | Status: DC | PRN
  Filled 2013-02-22: qty 1

## 2013-02-22 MED ORDER — FENTANYL CITRATE 0.05 MG/ML IJ SOLN
INTRAMUSCULAR | Status: DC | PRN
Start: 2013-02-22 — End: 2013-02-22
  Administered 2013-02-22 (×4): 50 ug via INTRAVENOUS

## 2013-02-22 MED ORDER — ACETAMINOPHEN 325 MG PO TABS
650.0000 mg | ORAL_TABLET | ORAL | Status: DC | PRN
  Filled 2013-02-22: qty 2

## 2013-02-22 MED ORDER — DEXAMETHASONE SODIUM PHOSPHATE 4 MG/ML IJ SOLN
INTRAMUSCULAR | Status: DC | PRN
  Administered 2013-02-22: 10 mg via INTRAVENOUS

## 2013-02-22 SURGICAL SUPPLY — 62 items
BAG URINE DRAINAGE (UROLOGICAL SUPPLIES) ×3 IMPLANT
BLADE SURG 10 STRL SS (BLADE) ×3 IMPLANT
BLADE SURG 15 STRL LF DISP TIS (BLADE) ×2 IMPLANT
BLADE SURG 15 STRL SS (BLADE) ×1
BLADE SURG ROTATE 9660 (MISCELLANEOUS) IMPLANT
BOOTIES KNEE HIGH SLOAN (MISCELLANEOUS) ×3 IMPLANT
CANISTER SUCTION 1200CC (MISCELLANEOUS) IMPLANT
CANISTER SUCTION 2500CC (MISCELLANEOUS) ×9 IMPLANT
CATH FOLEY 2WAY SLVR  5CC 16FR (CATHETERS) ×1
CATH FOLEY 2WAY SLVR 5CC 16FR (CATHETERS) ×2 IMPLANT
CLOTH BEACON ORANGE TIMEOUT ST (SAFETY) ×3 IMPLANT
COVER MAYO STAND STRL (DRAPES) ×3 IMPLANT
COVER TABLE BACK 60X90 (DRAPES) ×3 IMPLANT
DERMABOND ADVANCED (GAUZE/BANDAGES/DRESSINGS)
DERMABOND ADVANCED .7 DNX12 (GAUZE/BANDAGES/DRESSINGS) IMPLANT
DEVICE CAPIO SLIM BOX (INSTRUMENTS) IMPLANT
DEVICE CAPIO SLIM SINGLE (INSTRUMENTS) IMPLANT
DEVICE CAPIO SUTURING (INSTRUMENTS)
DEVICE CAPIO SUTURING OPC (INSTRUMENTS) IMPLANT
DISSECTOR ROUND CHERRY 3/8 STR (MISCELLANEOUS) ×3 IMPLANT
DRAPE CAMERA CLOSED 9X96 (DRAPES) ×3 IMPLANT
DRAPE UNDERBUTTOCKS STRL (DRAPE) ×3 IMPLANT
FLOSEAL 10ML (HEMOSTASIS) ×3 IMPLANT
GAUZE SPONGE 4X4 16PLY XRAY LF (GAUZE/BANDAGES/DRESSINGS) ×3 IMPLANT
GLOVE BIO SURGEON STRL SZ7.5 (GLOVE) ×3 IMPLANT
GOWN PREVENTION PLUS LG XLONG (DISPOSABLE) ×3 IMPLANT
GOWN STRL REIN XL XLG (GOWN DISPOSABLE) ×3 IMPLANT
GOWN STRL REUS W/ TWL LRG LVL3 (GOWN DISPOSABLE) ×2 IMPLANT
GOWN STRL REUS W/TWL LRG LVL3 (GOWN DISPOSABLE) ×4 IMPLANT
NEEDLE 1/2 CIR CATGUT .05X1.09 (NEEDLE) IMPLANT
NEEDLE HYPO 22GX1.5 SAFETY (NEEDLE) ×6 IMPLANT
PACKING VAGINAL (PACKING) ×3 IMPLANT
PENCIL BUTTON HOLSTER BLD 10FT (ELECTRODE) ×3 IMPLANT
PLUG CATH AND CAP STER (CATHETERS) ×3 IMPLANT
RETRACTOR LONRSTAR 16.6X16.6CM (MISCELLANEOUS) ×2 IMPLANT
RETRACTOR STAY HOOK 5MM (MISCELLANEOUS) ×3 IMPLANT
RETRACTOR STER APS 16.6X16.6CM (MISCELLANEOUS) ×3
SET IRRIG Y TYPE TUR BLADDER L (SET/KITS/TRAYS/PACK) ×3 IMPLANT
SHEET LAVH (DRAPES) ×3 IMPLANT
SLING SOLYX SYSTEM SIS EA (Sling) ×3 IMPLANT
SPONGE LAP 4X18 X RAY DECT (DISPOSABLE) ×6 IMPLANT
SUCTION FRAZIER TIP 10 FR DISP (SUCTIONS) ×3 IMPLANT
SUT ABS MONO DBL WITH NDL 48IN (SUTURE) IMPLANT
SUT CAPIO POLYGLYCOLIC (SUTURE) IMPLANT
SUT ETHILON 2 0 PS N (SUTURE) IMPLANT
SUT MON AB 2-0 SH 27 (SUTURE)
SUT MON AB 2-0 SH27 (SUTURE) IMPLANT
SUT NONABSORB MONO DB W/NDL 48 (SUTURE) IMPLANT
SUT PDS AB 3-0 SH 27 (SUTURE) IMPLANT
SUT VIC AB 0 CT1 36 (SUTURE) IMPLANT
SUT VIC AB 2-0 CT1 27 (SUTURE)
SUT VIC AB 2-0 CT1 TAPERPNT 27 (SUTURE) IMPLANT
SUT VIC AB 2-0 UR6 27 (SUTURE) ×42 IMPLANT
SYR BULB IRRIGATION 50ML (SYRINGE) ×3 IMPLANT
SYR CONTROL 10ML LL (SYRINGE) ×3 IMPLANT
SYRINGE 10CC LL (SYRINGE) ×3 IMPLANT
SYS PRF KIT VAG SUP UPHOLD (Sling) ×3 IMPLANT
TISSUE REPAIR XENFORM 4X7CM (Tissue) ×3 IMPLANT
TRAY DSU PREP LF (CUSTOM PROCEDURE TRAY) ×3 IMPLANT
TUBE CONNECTING 12X1/4 (SUCTIONS) ×6 IMPLANT
WATER STERILE IRR 500ML POUR (IV SOLUTION) ×6 IMPLANT
YANKAUER SUCT BULB TIP NO VENT (SUCTIONS) IMPLANT

## 2013-02-22 NOTE — Anesthesia Postprocedure Evaluation (Signed)
  Anesthesia Post-op Note  Patient: [name] [name]  Procedure(s) Performed: Procedure(s) (LRB): BOSTON SCIENTIFIC UPHOLD LITE POSTERIOR VAULT PROLAPSE REPAIR  (N/A)  KELLY PLICATION POSTERIOR WITH COLOPLAST TISSUE SOLYX SLING  (N/A)  Patient Location: PACU  Anesthesia Type: General  Level of Consciousness: awake and alert   Airway and Oxygen Therapy: Patient Spontanous Breathing  Post-op Pain: mild  Post-op Assessment: Post-op Vital signs reviewed, Patient's Cardiovascular Status Stable, Respiratory Function Stable, Patent Airway and No signs of Nausea or vomiting  Last Vitals:  Filed Vitals:   02/22/13 1200  BP: 122/67  Pulse: 94  Temp:   Resp: 19    Post-op Vital Signs: stable   Complications: No apparent anesthesia complications

## 2013-02-22 NOTE — Op Note (Signed)
Pre-operative diagnosis : Stage III anterior vault prolapse, stage II posterior vault prolapse, hypermobility urethra with stress urinary incontinence  Postoperative diagnosis:   Same  Operation: Anterior vault prolapse repair with Claiborne Billings plication and [employer] uphold mesh sacrospinous fixation repair; stage II rectocele repair with Xeroform implant; mid urethral urinary sling using Solyx. mesh.  Surgeon:  Chauncey Cruel. Gaynelle Arabian, MD  First assistant:  None  Anesthesia: General LMA  Preparation:  After appropriate preanesthesia, the patient was brought to the operative, placed on the operating table in the dorsal supine position where general LMA anesthesia was introduced. She was replaced in the dorsal lithotomy position with pubis was prepped with Betadine solution and draped in usual fashion. The history was reviewed.  Review history:  Chief Complaint  cc: Dr. Renato Shin  Reason For Visit  8 mo f/u  Active Problems  Problems  1. Backache 724.5  2. Breast Cancer 174.9  3. Chronic Constipation 564.00  4. Chronic Cystitis 595.2  5. Delays In Starting Urination (Hesitancy)  6. 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 Dysuria 788.1  8. Feelings Of Urinary Urgency 788.63  9. Hyperactivity Of The Bladder 596.51  10. Incomplete Emptying Of Bladder 788.21  11. Microscopic Hematuria 599.72  12. Muscle Weakness 728.87  13. Nocturia 788.43  14. Poor Coordination 781.3  15. 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 Urinary Loss Of Control 788.30  17. Urinary Stream Is Smaller 788.62  18. Urinary Stream Starts And Stops 788.61  19. Urinary Tract Infection 599.0  20. Urinary Tract Infection 599.0  21. Urinary Tract Infection 599.0  22. 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 Hx of recurrent urinary tract infections and microhematuria for which she has been taking Nitrofurantoin for suppression. She hasn't had any UTI's  since the last OV, but thinks she is having side effects from medication. She complains of lethargic & fatigue. She saw Dr. Jenny Reichmann this AM in Dr. Cordelia Pen absence, with blood work pending.  She has a history of stress urinary incontinence, as well as urge incontinence. She has done PT x 2. She had back surgery in January in [place], [place]. Her thigh and hip pain has resolved since her back surgery.  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 She is para 3-3-zero, with a difficult third delivery 41 years ago via a midwife in [place]. She has coincidental chronic constipation thought secondary to birth rectal trauma with known unrepaired vaginal tear during her last delivery. She stopped the Miralax because it wasn't helping w/ constipation. She complains that each time she tries to have BM, she notes the "ball comes out.  Of note is right breast cancer 2 years ago, treated at [facility] treated with lumpectomy, chemotherapy, radiation, and tamoxifen.  Physical examination showed a positive Marshall test. 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 Stage III vault prolapse is demonstrated on office examination.  Video urodynamics on 12/17/11 showed first sensation of filling at 403 cc, normal desire at 520 cc, strong desire at 813 cc. The bladder is stable. There is no rapid fill, or cough induced instability during the study.  The patient did not leak for leak point pressure study with abdominal pressure duration of 96 cm H20.  Pressure flow study: The patient is able to void 1000 cc with a maximum flow rate of 34 cc per second.  The maximum detrusor pressure is 32 cm water.  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    Statement of  Likelihood of Success: Excellent. TIME-OUT observed.:  Procedure:  Vaginal inspection revealed stage III anterior vault prolapse, and stage II posterior vault  prolapse, as well as hypermobility to urethra. Using a marking pen, the midline urethra was marked. Foley cath was placed, and using a marking pen, the bladder neck was marked. A horizontal line was drawn 2 cm proximal to the urethrovaginal line, and a 2-0 Vicryl suture was placed in the vaginal cuff. Area was marked for rectocele repair, with marking the perineal body, and triangular perineal body incision, as well as midline dissection of the posterior vaginal wall.  80 cc of Marcaine 1% with epinephrine 1 200,000 was injected in the area of incision and repair for the anterior vault prolapse repair, as well as the rectocele repair, as well as the proposed midureteral sling. This afforded both Hydro dissection, as well as postop analgesia.  Horizontal incision was begun 2 cm proximal to the urethra oh vaginal line. A 5 cm incision was made. Subcutaneous tissue is dissected with sharp and blunt dissection. Dissection was carried to the pelvic floor, where the ischial spines were identified bilaterally, and the sacrospinous ligament were identified bilaterally. A Kelly plication was accomplished after dissection of the bladder off the vaginal mucosa was accomplished with sharp and blunt dissection, using horizontal mattress 2-0 Vicryl sutures. IV methylene blue was given, but no blue urine was identified. The ureteral orifices were identified. Urine was identified from the right ureteral orifice.  Following a Kelly plication, and dissection to the pelvic floor, the [employer] uphold light [employer] device was used bilaterally to place sutures bilaterally, one finger breath medial to the ischial spines within the sacrospinous ligaments. Following this, the sleeves were brought into the wound, and were felt to be in good position. The mesh was then sutured in the midline with 2-0 Vicryl suture, and 6 tacking sutures were used to secure the mesh in proper position. The mesh was smooth against the  bladder wall. The mesh arms were then brought diagonally across the midline, and cut in standard fashion, with removal of the plastic sleeves, and the excess suture. No suture or plastic was left behind. Irrigation was accomplished. Excellent vault repair was accomplished. A very large amount of anterior vaginal tissue was remaining, and the tissue edges were freshened, in order to afford a more viable closure. Some bleeding was noted from venous plexus in the right lateral portion of the dissection, and from the left posterior portion of the dissection. I elected to use 10 cc of FloSeal, packed the wound for 3 minutes. This successfully stop any bleeding. The wound was then closed with running 2-0 Vicryl suture without difficulty.  Attention was then directed to the rectocele. With fresh blue markings, and injection for hydrodissection as noted above, triangular incision was made 4 perineal body excision, and midline incision was made and dissection was accomplished with care taken to avoid injury to the rectum. Following mucosal dissection, incision was made in the mucosa to within 2 cm of the apex. Rectal examination revealed no evidence of any rectal injury. Horizontal mattress 2-0 Vicryl sutures were placed. A 4 cm x 7 cm portion of Xeroform fascia was then used to support the repair. This was folded lengthwise, and inlaid over the horizontal mattress sutures. The posterior vaginal mucosa was then closed over the tissue, suturing it in  place. No bleeding was noted. Following midline posterior vaginal epithelial closure, interrupted 2-0 Vicryl sutures were used to secure the perineal body closure.  Following irrigation with antibiotic irrigation, attention was directed toward the midureteral sling. It is noted that cystoscopy is accomplished after both the anterior vault suspension, and the rectocele repair. After re\re marking the mid urethra, and injection of Marcaine and epinephrine in the periurethral  spaces, a 2 cm midline incision was made in the mid urethra, subcutaneous tissue dissected bilaterally to the level of pubis. Using the Solyx sling insertion tool, the midureteral sling was placed bilaterally into the obturator internus without difficulty. Following insertion of the right side, it is noted that the more than 50% was inserted on the right side, as noted by the marking on the mesh with a blue marking pen. The left side was placed without difficulty. Direct vision showed the mesh to be smooth against the urethra mucosa. This was placed with the Foley catheter in position. The mesh was against the urethra, but did not appear to be too tight. Pillowing was noted. The incision was closed with running 2-0 Vicryl suture.  The patient received a B. and O. suppository, as well as IV Tylenol, and IV Toradol. It is noted at both cystoscopies, that the patient had trabeculation, diverticular formation, as well as chronic inflammatory changes within her bladder. Although no blue contrast was identified, urine was identified from the right ureteral orifice. She tolerated procedure well, was awakened, and taken to recovery room in good condition.

## 2013-02-22 NOTE — Anesthesia Preprocedure Evaluation (Signed)
Anesthesia Evaluation  Patient identified by MRN, date of birth, ID band Patient awake    Reviewed: Allergy & Precautions, H&P , NPO status , Patient's Chart, lab work & pertinent test results  Airway Mallampati: II TM Distance: <3 FB Neck ROM: Full    Dental no notable dental hx.    Pulmonary sleep apnea and Continuous Positive Airway Pressure Ventilation ,  breath sounds clear to auscultation  Pulmonary exam normal       Cardiovascular Rhythm:Regular Rate:Normal     Neuro/Psych negative neurological ROS  negative psych ROS   GI/Hepatic Neg liver ROS, GERD-  ,  Endo/Other  Hypothyroidism   Renal/GU negative Renal ROS  negative genitourinary   Musculoskeletal negative musculoskeletal ROS (+)   Abdominal   Peds negative pediatric ROS (+)  Hematology negative hematology ROS (+)   Anesthesia Other Findings   Reproductive/Obstetrics negative OB ROS                           Anesthesia Physical Anesthesia Plan  ASA: II  Anesthesia Plan: General   Post-op Pain Management:    Induction: Intravenous  Airway Management Planned: Oral ETT  Additional Equipment:   Intra-op Plan:   Post-operative Plan: Extubation in OR  Informed Consent: I have reviewed the patients History and Physical, chart, labs and discussed the procedure including the risks, benefits and alternatives for the proposed anesthesia with the patient or authorized representative who has indicated his/her understanding and acceptance.   Dental advisory given  Plan Discussed with: CRNA and Surgeon  Anesthesia Plan Comments:         Anesthesia Quick Evaluation

## 2013-02-22 NOTE — Transfer of Care (Signed)
Immediate Anesthesia Transfer of Care Note  Patient: [name] [name]  Procedure(s) Performed: Procedure(s) (LRB): BOSTON SCIENTIFIC UPHOLD LITE POSTERIOR VAULT PROLAPSE REPAIR  (N/A)  KELLY PLICATION POSTERIOR WITH COLOPLAST TISSUE SOLYX SLING  (N/A)  Patient Location: PACU  Anesthesia Type: General  Level of Consciousness: awake, alert  and oriented  Airway & Oxygen Therapy: Patient Spontanous Breathing and Patient connected to face mask oxygen  Post-op Assessment: Report given to PACU RN and Post -op Vital signs reviewed and stable  Post vital signs: Reviewed and stable  Complications: No apparent anesthesia complications

## 2013-02-22 NOTE — H&P (Signed)
Chief Complaint  cc: Dr. Renato Shin   Reason For Visit  8 mo f/u   Active Problems Problems  1. Backache 724.5 2. Breast Cancer 174.9 3. Chronic Constipation 564.00 4. Chronic Cystitis 595.2 5. 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 Dysuria 788.1 8. Feelings Of Urinary Urgency 788.63 9. 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 Incomplete Emptying Of Bladder 788.21 11. Microscopic Hematuria 599.72 12. Muscle Weakness 728.87 13. Nocturia 788.43 14. Poor Coordination 781.3 15. 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 Urinary Loss Of Control 788.30 17. Urinary Stream Is Smaller 788.62 18. Urinary Stream Starts And Stops 788.61 19. Urinary Tract Infection 599.0 20. Urinary Tract Infection 599.0 21. Urinary Tract Infection 599.0 22. 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  Hx of recurrent urinary tract infections and microhematuria for which she has been taking Nitrofurantoin for suppression.  She hasn't had any UTI's since the last OV, but thinks she is having side effects from medication.  She complains of lethargic & fatigue. She saw Dr. Jenny Reichmann this AM in Dr. Cordelia Pen absence, with blood work pending.     She has a history of stress urinary incontinence, as well as urge incontinence.  She has done PT x 2. She had back surgery in January  in [place], [place]. Her thigh and hip pain has resolved since her back surgery.     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 She is para 3-3-zero, with a difficult third delivery 41 years ago via a midwife in [place]. She has coincidental chronic constipation thought secondary to birth rectal trauma with known unrepaired vaginal tear during her last delivery.  She stopped the Miralax because it wasn't helping w/ constipation. She complains that each time she tries to have BM, she  notes the "ball comes out.     Of note is right breast cancer 2 years ago, treated at [facility] treated with lumpectomy, chemotherapy, radiation, and tamoxifen.  Physical examination showed a positive Marshall test. 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   Video urodynamics on 12/17/11 showed first sensation of filling at 403 cc, normal desire at 520 cc, strong desire at 813 cc. The bladder is stable. There is no rapid fill, or cough induced instability during the study.  The patient did not leak for leak point pressure study with abdominal pressure duration of 96 cm H20.  Pressure flow study: The patient is able to void 1000 cc with a maximum flow rate of 34 cc per second. The maximum detrusor pressure is 32 cm water.    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   Past Medical History Problems  1. History of  Arthritis V13.4 2. History of  Asthma 493.90 3. History of  Breast Cancer V10.3 4. History of  Functional Murmur 785.2 5. History of  Heartburn 787.1 6. History of  Hypercholesterolemia 272.0 7. History of  Hypertension 401.9  Surgical History Problems  1. History of  Breast Surgery 2. History of  Breast Surgery Lumpectomy 3. History of  Hysterectomy  Current Meds 1. Caltrate 600 TABS; Therapy: (Recorded:14Jan2009) to 2. Gabapentin 100 MG Oral Capsule; Therapy: 12Sep2012 to 3. Levoxyl 25 MCG Oral Tablet; Therapy: (Recorded:05Dec2007) to 4. LORazepam 0.5 MG Oral Tablet; Therapy:  28Feb2013 to 5. Motrin TABS; Therapy: (Recorded:02Feb2011) to 6. NexIUM 40 MG Oral Capsule Delayed Release; Therapy: (Recorded:13Nov2007) to 7. Nitrofurantoin Monohyd Macro 100 MG Oral Capsule; TAKE 1 CAPSULE Daily; Therapy:  [id number] to (Evaluate:30Nov2014)  Requested for: [id number]; Last Rx:05Dec2013 8. Singulair 10 MG Oral Tablet; Therapy: (Recorded:14Jan2009) to 9.  Tamoxifen Citrate 20 MG Oral Tablet; Therapy: [id number] to 10. TraMADol HCl 50 MG Oral Tablet; Therapy: [id number] to  Allergies Medication  1. Bacitracin OINT 2. Penicillins  Family History Problems  1. Family history of  Arthritis V17.7 2. Family history of  Family Health Status Number Of Children 3 adult children, gender unknown 3. Paternal history of  Hematuria 4. Family history of  Hypertension 5. Paternal history of  Prostate Cancer V16.42 questionable  Social History Problems  1. Activities Of Daily Living 2. Family history of  Death In The Family Father 83, details unknown 3. Family history of  Death In The Family Mother 21, details unknown 4. Exercise Habits No exercise at this time due to her joint issues. 5. Living Independently With Spouse 6. Marital History - Currently Married 7. Never A Smoker 8. Occupation: retired Secretary/administrator 9. Self-reliant In Usual Daily Activities Denied  10. History of  Alcohol Use 11. History of  Tobacco Use V15.82  Review of Systems Genitourinary, constitutional, skin, eye, otolaryngeal, hematologic/lymphatic, cardiovascular, pulmonary, endocrine, musculoskeletal, gastrointestinal, neurological and psychiatric system(s) were reviewed and pertinent findings if present are noted.  Genitourinary: urinary frequency, urinary urgency, nocturia, incontinence, urinary stream starts and stops, incomplete emptying of bladder, hematuria and initiating urination requires straining.  Gastrointestinal: constipation.  Constitutional: feeling tired (fatigue).    Vitals Vital Signs [Data Includes: Last 1 Day]  06Aug2014 01:47PM  BMI Calculated: 30.68 BSA Calculated: 1.8 Height: 5 ft 2.5 in Weight: 171 lb  Blood Pressure: 131 / 76 Heart Rate: 78  Physical Exam Constitutional: Well nourished and well developed . No acute distress.  ENT:. The ears and nose are normal in appearance.  Neck: The appearance of the neck is normal.  Pulmonary: No  respiratory distress.  Cardiovascular:. No peripheral edema.  Abdomen: The abdomen is mildly obese. The abdomen is soft and nontender. No masses are palpated. No hernias are palpable. No hepatosplenomegaly noted.  Genitourinary:  Chaperone Present: kim lewis.  Examination of the external genitalia shows normal female external genitalia and no vulvar atrophy. The urethra is not tender, does not appear stenotic and no urethral caruncle. Urethral hypermobility is present. There is no urethral discharge. No periurethral cyst is identified. There is no urethral prolapse. Vaginal exam demonstrates atrophy and the vaginal epithelium to be poorly estrogenized. A cystocele is present with a midline defect (grade 3 /4). A rectocele is present with a midline defect (grade 3 /4). The cervix is is absent. The uterus is absent, but non tender. The bladder is normal on palpation, non tender and not distended. The anus is normal on inspection. The perineum is normal on inspection.  Lymphatics: The femoral and inguinal nodes are not enlarged or tender.  Skin: Normal skin turgor, no visible rash and no visible skin lesions.  Neuro/Psych:. Mood and affect are appropriate.    Results/Data Urine [Data Includes: Last 1 Day]   [id number]  COLOR YELLOW   APPEARANCE CLEAR   SPECIFIC GRAVITY 1.020   pH 5.5   GLUCOSE NEG mg/dL  BILIRUBIN NEG   KETONE NEG mg/dL  BLOOD TRACE   PROTEIN NEG mg/dL  UROBILINOGEN 0.2 mg/dL  NITRITE NEG   LEUKOCYTE ESTERASE  NEG   SQUAMOUS EPITHELIAL/HPF NONE SEEN   WBC 0-2 WBC/hpf  RBC 0-2 RBC/hpf  BACTERIA NONE SEEN   CRYSTALS NONE SEEN   CASTS NONE SEEN    Assessment Assessed  1. 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 Breast Cancer 174.9 3. 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 She would like kidney u/s and this is reasonable. She may have low back pain because of her extensive Stage III anterior and posterior  prolapse, and hypermobile urethra. We have discussed transvaginal repair with sacrospinus fixation and [employer] mesh fixation and Kelly plication of the anterior vault, and rectocele augmente repair; and Solyx mid-urethral sling. She would like this to be done in October. In addition to her anatomy, we have discussed how the surgery would affect her anatomy, suture placement and mesh complications. I would anticipate a complication rate of 2%, same as published rate. Transvaginal mesh with sacrospinus ligament fixation will give her the best chance of pernament repair, and least chance of breakdown of anterior native tissue repair, or augmanted repair. We will proceed with augmented posterior vault repair. She has chronic constipation, and will clean her bowel out pre-op.   Plan Backache (724.5)  1. RENAL U/S COMPLETE  Requested for: 08Aug2014 Chronic Constipation (564.00)  2. Polyethylene Glycol 3350 Oral Packet; MIX 1 PACKET IN 8 OUNCES OF LIQUID AND  DRINK TWICE DAILY; Therapy: [id number] to 06Aug2014; Last Rx:12Jul2013; Status:  DISCONTINUED Health Maintenance (V70.0)  3. UA With REFLEX  Done: [id number] 01:30PM Vaginal Wall Prolapse (618.00)  4. Pelvic Exam  Done: [id number]   Plan surgery for 2nd week in October. Pre surgery bowel prep.   Signatures

## 2013-02-22 NOTE — Discharge Instructions (Addendum)
Anterior or Posterior Colporrhaphy, Sling Procedure °Care After °Refer to this sheet in the next few weeks. These instructions provide you with information on caring for yourself after your procedure. Your caregiver may also give you specific instructions. Your treatment has been planned according to current medical practices, but problems sometimes occur. Call your caregiver if you have any problems or questions after your procedure. °HOME CARE INSTRUCTIONS °· Rest as much as possible the first 2 weeks.  °· Avoid heavy lifting (more than 10 pounds or 4.5 kg), pushing, or pulling. Limit stair climbing to once or twice a day the first week, then slowly increase this activity.  °· Avoid standing for prolonged periods of time.  °· Talk with your caregiver about when you may resume your usual physical activity.  °· You may resume your normal diet.  °· Limit fluids to 6 to 8 glasses of non-caffeinated beverages per day.  °· Eat a well-balanced diet. Daily portions of food from the meat (protein), milk, fruit, vegetable, and bread families are necessary for your health.  °· Your normal bowel function should return. If constipation should occur, you may:  °· Take a mild laxative.  °· Add fruit and bran to your diet.  °· Drink more liquids.  °· If additional bladder care is required, you will receive instructions from your caregiver. Call your caregiver if you experience burning, urgency, or frequency on urination or if you are unable to completely empty your bladder.  °· You may take a shower and wash your hair.  °· Only take over-the-counter or prescription medicines for pain, fever, or discomfort as directed by your caregiver.  °· Clean the incision with water. Do not use a dressing unless the incision is draining or irritated. Check your incision daily for redness, draining, swelling, or separation of the skin. Call your caregiver if you notice any of these.  °· Keep your perineal area (the area between vagina and  rectum) clean and dry. Perform perineal care after every bowel movement and each time you urinate. You may take a sitz bath or sit in a tub of clean, warm water when necessary, unless your caregiver tells you otherwise.  °· Do not have sexual intercourse until permitted by your caregiver.  °· It is still important to have a yearly pelvic exam.  °SEEK MEDICAL CARE IF: °· You have shaking chills.  °· You have an oral temperature above 102° F (38.9° C).  °· Your pain is not relieved or becomes severe.  °MAKE SURE YOU:  °· Understand these instructions.  °· Will watch your condition.  °· Will get help right away if you are not doing well or get worse.  °Document Released: 08/22/2003 Document Revised: 12/27/2010 Document Reviewed: 05/13/2007 °[employer]® Patient Information ©2012 [employer]. °Post Anesthesia Home Care Instructions ° °Activity: °Get plenty of rest for the remainder of the day. A responsible adult should stay with you for 24 hours following the procedure.  °For the next 24 hours, DO NOT: °-Drive a car °-Operate machinery °-Drink alcoholic beverages °-Take any medication unless instructed by your physician °-Make any legal decisions or sign important papers. ° °Meals: °Start with liquid foods such as gelatin or soup. Progress to regular foods as tolerated. Avoid greasy, spicy, heavy foods. If nausea and/or vomiting occur, drink only clear liquids until the nausea and/or vomiting subsides. Call your physician if vomiting continues. ° °Special Instructions/Symptoms: °Your throat may feel dry or sore from the anesthesia or the breathing tube placed in   your throat during surgery. If this causes discomfort, gargle with warm salt water. The discomfort should disappear within 24 hours. SACRAL DRESSING (Lower Back)    A pressure ulcer is a sore where the skin breaks open    This dressing will be placed on your lower back to protect this area from pressure and moisture and in many cases helps prevent pressure  ulcers from forming    A nurse may place this dressing before your surgery or another procedure    A nurse may also place this dressing if you have other conditions that put you at risk for developing a pressure ulcer    If you are getting up and moving around after surgery, the dressing may be taken off with your first shower. Simply remove it and throw it away.    While you are in the hospital, nurses will change the dressing twice a week as long as you are still at risk for developing a pressure ulcer    This dressing is latex free and made with silicone (for adhesive sensitivity) so it is safe and gentle to the skin

## 2013-02-22 NOTE — Progress Notes (Signed)
Urology Progress Note  Day of Surgery   Subjective:Pt up walking with staff. Slight pain this Am, but none this afternoon. Will begin Dulcolax tonight. Urine blue tonight. ( 2ndary methylene blue)     No acute urologic events overnight. Ambulation:   positive Flatus:    positive Bowel movement  negative  Pain: some relief  Objective:  Blood pressure 124/66, pulse 91, temperature 97.2 F (36.2 C), temperature source Oral, resp. rate 16, height 5' 2.5" (1.588 m), weight 77.111 kg (170 lb), SpO2 95.00%.  Physical Exam:  General:  No acute distress, awake Resp: clear to auscultation bilaterally Genitourinary:  Normal external. No bleeding.  Foley:  In place until AM       Recent Labs     02/22/13  0719  HGB  13.6    Recent Labs     02/22/13  0719  NA  142  K  3.7     No results found for this basename: PT, INR, APTT,  in the last 72 hours   No components found with this basename: ABG,   Assessment/Plan:  Catheter not removed. Continue any current medications. Follow up in AM .

## 2013-02-22 NOTE — Interval H&P Note (Signed)
History and Physical Interval Note:  02/22/2013 8:40 AM  [name] [name]  has presented today for surgery, with the diagnosis of PELVIC PROLAPSE   The various methods of treatment have been discussed with the patient and family. After consideration of risks, benefits and other options for treatment, the patient has consented to  Procedure(s): [place]  (N/A)  KELLY PLICATION POSTERIOR WITH COLOPLAST TISSUE SOLYX SLING  (N/A) as a surgical intervention .  The patient's history has been reviewed, patient examined, no change in status, stable for surgery.  I have reviewed the patient's chart and labs.  Questions were answered to the patient's satisfaction.     Carolan Clines I

## 2013-02-23 ENCOUNTER — Encounter (HOSPITAL_BASED_OUTPATIENT_CLINIC_OR_DEPARTMENT_OTHER): Payer: Self-pay | Admitting: Urology

## 2013-02-23 LAB — BASIC METABOLIC PANEL
BUN: 12 mg/dL (ref 6–23)
CO2: 27 mEq/L (ref 19–32)
Calcium: 8.1 mg/dL — ABNORMAL LOW (ref 8.4–10.5)
Chloride: 103 mEq/L (ref 96–112)
Creatinine, Ser: 0.82 mg/dL (ref 0.50–1.10)
GFR calc non Af Amer: 71 mL/min — ABNORMAL LOW (ref >90)
GFR, EST AFRICAN AMERICAN: 83 mL/min — AB (ref >90)
Glucose, Bld: 101 mg/dL — ABNORMAL HIGH (ref 70–99)
POTASSIUM: 3.6 meq/L — AB (ref 3.7–5.3)
SODIUM: 140 meq/L (ref 137–147)

## 2013-02-23 MED ORDER — KETOROLAC TROMETHAMINE 30 MG/ML IJ SOLN
INTRAMUSCULAR | Status: AC
  Filled 2013-02-23: qty 1

## 2013-02-23 MED ORDER — KETOROLAC TROMETHAMINE 30 MG/ML IJ SOLN
30.0000 mg | Freq: Once | INTRAMUSCULAR | Status: AC
Start: 2013-02-23 — End: 2013-02-23
  Administered 2013-02-23: 30 mg via INTRAVENOUS
  Filled 2013-02-23: qty 1

## 2013-02-23 MED ORDER — CIPROFLOXACIN HCL 500 MG PO TABS
ORAL_TABLET | ORAL | Status: AC
  Filled 2013-02-23: qty 1

## 2013-02-23 MED ORDER — DIPHENHYDRAMINE HCL 50 MG/ML IJ SOLN
INTRAMUSCULAR | Status: AC
  Filled 2013-02-23: qty 1

## 2013-02-23 MED ORDER — HYDROCODONE-ACETAMINOPHEN 5-325 MG PO TABS
ORAL_TABLET | ORAL | Status: AC
  Filled 2013-02-23: qty 2

## 2013-02-23 NOTE — Progress Notes (Signed)
Foley catheter and vaginal packing removed per Dr's order.  Minimal bright red blood noted on packing and on peripad.  Peripad and mesh panty replaced.  Pt tolerated procedure well.  Instructed pt to call when feeling pressure to urinate.

## 2013-02-23 NOTE — Progress Notes (Signed)
Kim in Dr. Arlyn Leak office called, left message on voicemail regarding calling in prescription for Flomax to [place].

## 2013-03-11 ENCOUNTER — Other Ambulatory Visit: Payer: Medicare Other

## 2013-03-11 ENCOUNTER — Ambulatory Visit: Payer: Medicare Other | Admitting: Physician Assistant

## 2013-03-12 ENCOUNTER — Other Ambulatory Visit: Payer: Medicare Other

## 2013-03-15 ENCOUNTER — Other Ambulatory Visit: Payer: Self-pay | Admitting: Oncology

## 2013-03-15 DIAGNOSIS — C50919 Malignant neoplasm of unspecified site of unspecified female breast: Secondary | ICD-10-CM

## 2013-03-16 ENCOUNTER — Telehealth: Payer: Self-pay | Admitting: *Deleted

## 2013-03-16 ENCOUNTER — Other Ambulatory Visit: Payer: Self-pay | Admitting: *Deleted

## 2013-03-16 DIAGNOSIS — D509 Iron deficiency anemia, unspecified: Secondary | ICD-10-CM

## 2013-03-16 DIAGNOSIS — E876 Hypokalemia: Secondary | ICD-10-CM

## 2013-03-16 DIAGNOSIS — C50919 Malignant neoplasm of unspecified site of unspecified female breast: Secondary | ICD-10-CM

## 2013-03-16 NOTE — Telephone Encounter (Signed)
sw pt gv appt for labs for 03/19/13 @ 2pm. Pt is aware...td

## 2013-03-16 NOTE — Telephone Encounter (Signed)
Patient calling in to change appt for labs due to weather, also complains of bad leg cramps for past week. Reviewed labs in [facility] from other lab draws, patient has been having decrease in potassium and calcium levels. Patient denies taking lasix at this time. Encouraged to restart daily calcium supplement that she says she has stopped for past several weeks. Will change appt via POF. Confirmed time of 2pm on Friday 2/26

## 2013-03-18 ENCOUNTER — Ambulatory Visit: Payer: Medicare Other | Admitting: Physician Assistant

## 2013-03-18 ENCOUNTER — Other Ambulatory Visit: Payer: Medicare Other

## 2013-03-19 ENCOUNTER — Other Ambulatory Visit (HOSPITAL_BASED_OUTPATIENT_CLINIC_OR_DEPARTMENT_OTHER): Payer: Medicare Other

## 2013-03-19 DIAGNOSIS — C50919 Malignant neoplasm of unspecified site of unspecified female breast: Secondary | ICD-10-CM

## 2013-03-19 DIAGNOSIS — D509 Iron deficiency anemia, unspecified: Secondary | ICD-10-CM

## 2013-03-19 DIAGNOSIS — E876 Hypokalemia: Secondary | ICD-10-CM

## 2013-03-19 DIAGNOSIS — D649 Anemia, unspecified: Secondary | ICD-10-CM

## 2013-03-19 LAB — COMPREHENSIVE METABOLIC PANEL (CC13)
ALBUMIN: 3.6 g/dL (ref 3.5–5.0)
ALT: 30 U/L (ref 0–55)
AST: 29 U/L (ref 5–34)
Alkaline Phosphatase: 63 U/L (ref 40–150)
Anion Gap: 8 mEq/L (ref 3–11)
BUN: 9.5 mg/dL (ref 7.0–26.0)
CALCIUM: 9.2 mg/dL (ref 8.4–10.4)
CHLORIDE: 106 meq/L (ref 98–109)
CO2: 28 mEq/L (ref 22–29)
Creatinine: 0.7 mg/dL (ref 0.6–1.1)
GLUCOSE: 132 mg/dL (ref 70–140)
Potassium: 3.5 mEq/L (ref 3.5–5.1)
Sodium: 142 mEq/L (ref 136–145)
Total Bilirubin: 0.22 mg/dL (ref 0.20–1.20)
Total Protein: 7 g/dL (ref 6.4–8.3)

## 2013-03-19 LAB — CBC WITH DIFFERENTIAL/PLATELET
BASO%: 0.7 % (ref 0.0–2.0)
BASOS ABS: 0.1 10*3/uL (ref 0.0–0.1)
EOS ABS: 0.4 10*3/uL (ref 0.0–0.5)
EOS%: 4.3 % (ref 0.0–7.0)
HCT: 35.2 % (ref 34.8–46.6)
HEMOGLOBIN: 11.6 g/dL (ref 11.6–15.9)
LYMPH#: 2.6 10*3/uL (ref 0.9–3.3)
LYMPH%: 26.6 % (ref 14.0–49.7)
MCH: 32.9 pg (ref 25.1–34.0)
MCHC: 33 g/dL (ref 31.5–36.0)
MCV: 99.8 fL (ref 79.5–101.0)
MONO#: 0.9 10*3/uL (ref 0.1–0.9)
MONO%: 8.8 % (ref 0.0–14.0)
NEUT#: 5.9 10*3/uL (ref 1.5–6.5)
NEUT%: 59.6 % (ref 38.4–76.8)
Platelets: 250 10*3/uL (ref 145–400)
RBC: 3.52 10*6/uL — ABNORMAL LOW (ref 3.70–5.45)
RDW: 13.7 % (ref 11.2–14.5)
WBC: 9.8 10*3/uL (ref 3.9–10.3)

## 2013-03-19 LAB — IRON AND TIBC CHCC
%SAT: 16 % — ABNORMAL LOW (ref 21–57)
IRON: 37 ug/dL — AB (ref 41–142)
TIBC: 224 ug/dL — ABNORMAL LOW (ref 236–444)
UIBC: 187 ug/dL (ref 120–384)

## 2013-03-19 LAB — FERRITIN CHCC: Ferritin: 1058 ng/ml — ABNORMAL HIGH (ref 9–269)

## 2013-03-25 ENCOUNTER — Ambulatory Visit: Payer: Medicare Other | Admitting: Physician Assistant

## 2013-03-25 ENCOUNTER — Ambulatory Visit: Payer: Medicare Other | Admitting: Oncology

## 2013-03-26 ENCOUNTER — Telehealth: Payer: Self-pay | Admitting: Oncology

## 2013-03-26 ENCOUNTER — Other Ambulatory Visit: Payer: Self-pay | Admitting: *Deleted

## 2013-03-26 NOTE — Telephone Encounter (Signed)
, °

## 2013-03-29 ENCOUNTER — Ambulatory Visit (HOSPITAL_BASED_OUTPATIENT_CLINIC_OR_DEPARTMENT_OTHER): Payer: Medicare Other | Admitting: Oncology

## 2013-03-29 ENCOUNTER — Telehealth: Payer: Self-pay | Admitting: Oncology

## 2013-03-29 VITALS — BP 138/75 | HR 91 | Temp 98.4°F | Resp 20 | Ht 62.5 in | Wt 167.5 lb

## 2013-03-29 DIAGNOSIS — C50419 Malignant neoplasm of upper-outer quadrant of unspecified female breast: Secondary | ICD-10-CM

## 2013-03-29 DIAGNOSIS — G629 Polyneuropathy, unspecified: Secondary | ICD-10-CM

## 2013-03-29 DIAGNOSIS — Z170 Estrogen receptor positive status [ER+]: Secondary | ICD-10-CM

## 2013-03-29 DIAGNOSIS — C50919 Malignant neoplasm of unspecified site of unspecified female breast: Secondary | ICD-10-CM

## 2013-03-29 DIAGNOSIS — G609 Hereditary and idiopathic neuropathy, unspecified: Secondary | ICD-10-CM

## 2013-03-29 MED ORDER — TAMOXIFEN CITRATE 20 MG PO TABS: ORAL_TABLET | ORAL | Status: DC

## 2013-03-29 MED ORDER — MAGNESIUM OXIDE 400 (241.3 MG) MG PO TABS: 400.0000 mg | ORAL_TABLET | Freq: Two times a day (BID) | ORAL | Status: DC

## 2013-03-29 NOTE — Telephone Encounter (Signed)
, °

## 2013-03-29 NOTE — Progress Notes (Signed)
ID: Ivor Costa   DOB: Mar 09, 1945  MR#: [id number]  CSN#:[id number]  PCP: Renato Shin, MD GYN: Princess Bruins SU: Marissa Howard-McNatt OTHER MD: Evelina Bucy, Delfin Edis, Laqueta Jean, Sigmund Nelva Nay   HISTORY OF PRESENT ILLNESS: From Dr. Collier Salina Rubin's note 06/07/2009:  "She undergoes annual screening mammography.  She has had previous biopsies of both breasts showing benign disease.  She had a screening mammogram on 04/13/2009, which showed a possible mass in the right breast, further imaging is recommended.  She subsequently had a digital diagnostic mammogram and right breast ultrasound on 04/21/2009.  This showed a hyperechoic mass at 7 o'clock position measuring  5 x 5 x 7 mm, this was suspicious.  A clip was placed and a biopsy was performed on 05/11/2009.  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  This was ER positive 100% and PR positive 17%, proliferative index 68%, the HER-2 was not amplified with a ratio of 0.94.  The patient subsequently had an MRI scan of both breasts on 05/15/2009 showed a solitary mass in the upper outer quadrant of the right breast measuring 1.2 x 1.1 x 0.8 cm.  She subsequently was seen at [facility], underwent a lumpectomy and sentinel lymph node evaluation on 05/29/2009 by Dr. Mable Fill Howard-McNatt.  Final pathology showed a high-grade invasive ductal cancer measuring 1.2 cm.  Five sentinel lymph nodes were identified, all of which were negative for metastatic disease.  The invasive carcinoma was less than 1 mm from lateral margin.  A minute focus of DCIS was subsequently seen in the lateral margin, seen with re-excision measured 1.5 mm from the margin.  The other margins appeared to be technically clear.  This had overall histological grade 3. "   Her subsequent history is as detailed below  INTERVAL HISTORY: The patient returns today for followup of her breast cancer. Since her last visit  here she had neurosurgical intervention for her low back pain performed in [place] and her son-in-law is Network engineer. She also had an anterior vault repair procedure under Dr. Gaynelle Arabian February 20 15th.  REVIEW OF SYSTEMS: The surgery and to the lower back in [place] help the back pain quite a bit and that problem is now well-controlled. However Chauntel is not exercising regularly. Partly it is because she was told that for 3 months after her urologic procedure (which would take this into May) she has to "watch her activity level". She kept a Foley for 10 days postop and for a while had difficulty voiding, but that also has resolved. Aside from these issues, she continues to have reflux problems in her insurance company wants to charge her more than $150 for Nexium. She has cramps and wonders if she should take magnesium. She does not feel the Neurontin is very helpful. Occasionally she has blurred vision. This is inconstant. She has seasonal allergies. Her ankles swell at times. She feels anxious, but not depressed. She is tolerating the tamoxifen without any significant side effects that she is aware of. A detailed review of systems today was otherwise stable  PAST MEDICAL HISTORY: Past Medical History  Diagnosis Date  . Chronic cystitis   . Hx of adenomatous colonic polyps   . Hyperlipidemia   . Multinodular goiter   . Osteoporosis   . History of Helicobacter pylori infection   . Hypothyroidism   . Anxiety   . GERD (gastroesophageal reflux disease)   . H/O hiatal hernia   . 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 History of chemotherapy     taxoterte/cytoxan 4 cycles completed 09/08/09  . History of hypertension     no meds post chemo 2011 due to hypotention  . History of  radiation therapy     right breast cancer  2011  . Iron deficiency anemia     HX IRON INFUSION'S  LAST ONE AUG 2014  . Pelvic prolapse   . Urgency of urination   . Incomplete emptying of bladder   . OSA on CPAP     SLEEP STUDY 07-16-2007  MODERATE OSA    PAST SURGICAL HISTORY: Past Surgical History  Procedure Laterality Date  . Carpal tunnel release Bilateral 2000 (approx)  . Urethral dilation  1970'S  . Cataract extraction w/ intraocular lens  implant, bilateral    . Right breast lumpectomy w/  node dissection  05-29-2009  . Breast lumpectomy Left 04/2009    BENIGN  . Bilateral benign breast bx'  YRS AGO  . Abdominal hysterectomy  1980    W/ RIGHT SALPINGOOPHORECTOMY AND APPENDECTOMY  . Lumbar spine surgery  01-23-2013  . Transthoracic echocardiogram  10-16-2009    MILD LVH/  EF 79-89%/  GRADE I DIASTOLIC DYSFUNCTION  . Pubovaginal sling N/A 02/22/2013    Procedure: BOSTON SCIENTIFIC UPHOLD LITE POSTERIOR VAULT PROLAPSE REPAIR ;  Surgeon: Ailene Rud, MD;  Location: [facility];  Service: Urology;  Laterality: N/A;  . Anterior and posterior repair N/A 02/22/2013    Procedure:  KELLY PLICATION POSTERIOR WITH COLOPLAST TISSUE Velvet Bathe ;  Surgeon: Ailene Rud, MD;  [facility];  Service: Urology;  Laterality: N/A;    FAMILY HISTORY Family History  Problem Relation Age of Onset  . Asthma Mother   . Colon cancer Father 80  . Hypertension Sister   . Hypertension Brother   . Ulcerative colitis Son   . Asthma Son   . Stomach cancer Maternal Grandfather   . Thyroid disease Other     Aunt  . Arthritis Other   . Prostate cancer Other    the patient's father died at age 87 but she is not sure of the cause. The patient's mother is currently living, age 20. The patient had one brother and 4 sisters. There is no history of breast or ovarian cancer in the family to her knowledge.   GYNECOLOGIC HISTORY:  menarche age 1, first  live birth age 58, she is [place] P3. She had a hysterectomy approximately 32 years ago, and took hormone replacement for approximately 12 years after that.   SOCIAL HISTORY:  she used to work as a Public relations account executive, but is now retired. Her husband Ardis Rowan is a retired professor in the social studies Department at [employer]. Daughter Imagene Sheller is a Psychologist, prison and probation services in pediatric infectious diseases in [place]. Son Kyrstan Gotwalt is an Forensic psychologist in [place]. Son Anjani Feuerborn works in Charity fundraiser in [place]. The patient has 4 biological and one adopted grandchildren. She is not involved in organized religion.    ADVANCED DIRECTIVES: not in place   HEALTH MAINTENANCE: History  Substance Use Topics  . Smoking status: Never Smoker   . Smokeless tobacco: Never Used  .  Alcohol Use: No     Colonoscopy:  PAP:  Bone density:  Lipid panel:  Allergies  Allergen Reactions  . Penicillins Rash    Current Outpatient Prescriptions  Medication Sig Dispense Refill  . albuterol (PROAIR HFA) 108 (90 BASE) MCG/ACT inhaler Inhale 2 puffs into the lungs every 6 (six) hours as needed for wheezing or shortness of breath.  18 g  5  . Calcium Carbonate-Vitamin D (CALTRATE 600+D) 600-400 MG-UNIT per tablet Take 1 tablet by mouth daily.       . ciprofloxacin (CIPRO) 500 MG tablet Take 1 tablet (500 mg total) by mouth 2 (two) times daily.  10 tablet  0  . esomeprazole (NEXIUM) 40 MG capsule TAKE ONE CAPSULE BY MOUTH DAILY   takes inm am      . furosemide (LASIX) 20 MG tablet Take 20 mg by mouth as needed for fluid. Rarely for swelling      . gabapentin (NEURONTIN) 100 MG capsule Take 300 mg by mouth 2 (two) times daily.       Marland Kitchen ibuprofen (ADVIL,MOTRIN) 200 MG tablet Take 200 mg by mouth every 6 (six) hours as needed.      Marland Kitchen levothyroxine (SYNTHROID, LEVOTHROID) 25 MCG tablet Take 25 mcg by mouth daily before breakfast.      . LORazepam (ATIVAN) 0.5 MG tablet TAKE ONE TABLET BY MOUTH EVERY 4 HOURS  AS NEEDED  60 tablet  0  . montelukast (SINGULAIR) 10 MG tablet Take 10 mg by mouth at bedtime.      . naproxen sodium (ANAPROX) 220 MG tablet Take 220 mg by mouth as needed.      . ondansetron (ZOFRAN) 4 MG tablet Take 1 tablet (4 mg total) by mouth every 8 (eight) hours as needed for nausea (may repeat dose in 1 hr if no improvement).  20 tablet  0  . ondansetron (ZOFRAN) 8 MG tablet Take 1 tablet (8 mg total) by mouth every 8 (eight) hours as needed for nausea or vomiting (1/2 tablet).  20 tablet  0  . polyethylene glycol (MIRALAX / GLYCOLAX) packet May use 2 tablespoons in juice or water daily; or 1 packet.  15 each  11  . tamoxifen (NOLVADEX) 20 MG tablet TAKE ONE TABLET BY MOUTH EVERY DAY  90 tablet  1  . traMADol-acetaminophen (ULTRACET) 37.5-325 MG per tablet Take 1 tablet by mouth every 6 (six) hours as needed.  30 tablet  2  . trimethoprim (TRIMPEX) 100 MG tablet Take 1 tablet (100 mg total) by mouth 1 day or 1 dose.  30 tablet  1   No current facility-administered medications for this visit.    OBJECTIVE: middle-aged [place] woman in no acute distress Filed Vitals:   03/29/13 1240  BP: 138/75  Pulse: 91  Temp: 98.4 F (36.9 C)  Resp: 20     Body mass index is 30.13 kg/(m^2).    ECOG FS: 1  Sclerae unicteric, pupils equal round and reactive Oropharynx clear, teeth in good repair No cervical or supraclavicular adenopathy Lungs no rales or rhonchi Heart regular rate and rhythm Abd soft, nontender, positive bowel sounds MSK no focal spinal tenderness to vigorous percussion Neuro: nonfocal, well oriented, appropriate affect Breasts:  the right breast is status post lumpectomy and radiation. There is no evidence of local recurrence. The right axilla is benign. The left breast is unremarkable.    LAB RESULTS: Lab Results  Component Value Date   WBC 9.8 03/19/2013   NEUTROABS 5.9  03/19/2013   HGB 11.6 03/19/2013   HCT 35.2 03/19/2013   MCV 99.8 03/19/2013   PLT 250  03/19/2013      Chemistry      Component Value Date/Time   NA 142 03/19/2013 1414   NA 140 02/23/2013 0541   K 3.5 03/19/2013 1414   K 3.6* 02/23/2013 0541   CL 103 02/23/2013 0541   CL 106 02/28/2012 1328   CO2 28 03/19/2013 1414   CO2 27 02/23/2013 0541   BUN 9.5 03/19/2013 1414   BUN 12 02/23/2013 0541   CREATININE 0.7 03/19/2013 1414   CREATININE 0.82 02/23/2013 0541      Component Value Date/Time   CALCIUM 9.2 03/19/2013 1414   CALCIUM 8.1* 02/23/2013 0541   CALCIUM 9.3 12/25/2010 1428   ALKPHOS 63 03/19/2013 1414   ALKPHOS 79 01/18/2011 1339   AST 29 03/19/2013 1414   AST 37 01/18/2011 1339   ALT 30 03/19/2013 1414   ALT 39* 01/18/2011 1339   BILITOT 0.22 03/19/2013 1414   BILITOT 0.4 01/18/2011 1339       Lab Results  Component Value Date   LABCA2 4 03/28/2011    No components found with this basename: [id number]    No results found for this basename: INR,  in the last 168 hours  Urinalysis    Component Value Date/Time   COLORURINE LT. YELLOW 12/25/2010 1428   APPEARANCEUR CLEAR 12/25/2010 1428   LABSPEC 1.010 12/25/2010 1428   LABSPEC 1.005 08/18/2009 1244   PHURINE 6.5 12/25/2010 1428   HGBUR LARGE 12/25/2010 1428   BILIRUBINUR NEGATIVE 12/25/2010 1428   KETONESUR NEGATIVE 12/25/2010 1428   UROBILINOGEN 0.2 12/25/2010 1428   NITRITE NEGATIVE 12/25/2010 1428   LEUKOCYTESUR SMALL 12/25/2010 1428    STUDIES: Bone density 05/06/2012 shows mild osteopenia; mammography 05/06/2012 was unremarkable  ASSESSMENT: 68 y.o. [place] woman originally from [place]  (1) status post right lumpectomy 05/29/2009 for a  pT1c pN0, stage IA invasive ductal carcinoma, grade 3, estrogen receptor 100% and progesterone receptor 17% positive, with an MIB-1 of 68% and no HER-2 amplification.  (2) Oncotype score of 36 fell in the "high-risk" box, and predicted a 24% risk of distant recurrence within 10 years if the patient's only systemic treatment was tamoxifen for 5 years  (3) received cyclophosphamide/  docetaxel x4, completed 09/08/2009  (4) completed adjuvant radiation 12/08/2009  (5) tried letrozole and exemestane with significant side effects, but started tamoxifen in July 2012 and has tolerated it well.  (6) s/p Anterior vault prolapse repair with Tresa Endo plication and [employer] uphold mesh sacrospinous fixation repair; stage II rectocele repair with Xeroform implant; mid urethral urinary sling using Solyx. Mesh. 02/22/2013   PLAN:  She is doing very well from a breast cancer point of view and the plan is going to be to continue tamoxifen for a total of 5 years, through July of 2017. She was very concerned about her neuropathy, and I suggested she can stop the gabapentin and see if that makes any difference. If not she can leave it off. She would like to have a formal consultation regarding this and I am asking Dr. Allena Katz to evaluate her.  She will continue to see Dr. Patsi Sears regarding for pelvic issues and Dr. Juanda Chance regarding her reflux problems I have written for Wing to have her mammogram April 20 this year and I will see her the last week in April of next year after her next years mammogram. Today I refilled her tamoxifen  and wrote for magnesium oxide, which possibly could help with her cramps. She is aware that magnesium pills can cause diarrhea. I also suggested that she drink at least 2 quarts of fluid a day.  She has a good understanding of the overall plan and agrees with it. She knows to call for any problems that may develop before next visit here. Kenitha Glendinning C    03/29/2013

## 2013-05-03 ENCOUNTER — Ambulatory Visit: Payer: Medicare Other | Admitting: Neurology

## 2013-05-10 ENCOUNTER — Ambulatory Visit: Payer: Medicare Other

## 2013-05-10 ENCOUNTER — Ambulatory Visit (INDEPENDENT_AMBULATORY_CARE_PROVIDER_SITE_OTHER): Payer: Medicare Other | Admitting: Endocrinology

## 2013-05-10 ENCOUNTER — Encounter: Payer: Self-pay | Admitting: Endocrinology

## 2013-05-10 ENCOUNTER — Ambulatory Visit
Admission: RE | Admit: 2013-05-10 | Discharge: 2013-05-10 | Disposition: A | Discharge status: Home / Self Care | Payer: Medicare Other | Source: Ambulatory Visit | Attending: Oncology | Admitting: Oncology

## 2013-05-10 VITALS — BP 120/80 | HR 62 | Temp 98.6°F | Ht 62.5 in | Wt 167.0 lb

## 2013-05-10 DIAGNOSIS — E039 Hypothyroidism, unspecified: Secondary | ICD-10-CM

## 2013-05-10 DIAGNOSIS — M810 Age-related osteoporosis without current pathological fracture: Secondary | ICD-10-CM

## 2013-05-10 DIAGNOSIS — Z79899 Other long term (current) drug therapy: Secondary | ICD-10-CM

## 2013-05-10 DIAGNOSIS — K769 Liver disease, unspecified: Secondary | ICD-10-CM

## 2013-05-10 DIAGNOSIS — E785 Hyperlipidemia, unspecified: Secondary | ICD-10-CM

## 2013-05-10 DIAGNOSIS — E739 Lactose intolerance, unspecified: Secondary | ICD-10-CM

## 2013-05-10 DIAGNOSIS — C50919 Malignant neoplasm of unspecified site of unspecified female breast: Secondary | ICD-10-CM

## 2013-05-10 DIAGNOSIS — E042 Nontoxic multinodular goiter: Secondary | ICD-10-CM

## 2013-05-10 DIAGNOSIS — M25579 Pain in unspecified ankle and joints of unspecified foot: Secondary | ICD-10-CM | POA: Insufficient documentation

## 2013-05-10 DIAGNOSIS — Z23 Encounter for immunization: Secondary | ICD-10-CM

## 2013-05-10 DIAGNOSIS — F411 Generalized anxiety disorder: Secondary | ICD-10-CM

## 2013-05-10 DIAGNOSIS — I10 Essential (primary) hypertension: Secondary | ICD-10-CM

## 2013-05-10 LAB — URINALYSIS, ROUTINE W REFLEX MICROSCOPIC
Bilirubin Urine: NEGATIVE
KETONES UR: NEGATIVE
Nitrite: NEGATIVE
Specific Gravity, Urine: 1.015 (ref 1.000–1.030)
Total Protein, Urine: NEGATIVE
Urine Glucose: NEGATIVE
Urobilinogen, UA: 0.2 (ref 0.0–1.0)
pH: 6 (ref 5.0–8.0)

## 2013-05-10 LAB — LIPID PANEL
CHOL/HDL RATIO: 3
Cholesterol: 156 mg/dL (ref 0–200)
HDL: 55.9 mg/dL (ref >39.00)
LDL CALC: 66 mg/dL (ref 0–99)
TRIGLYCERIDES: 170 mg/dL — AB (ref 0.0–149.0)
VLDL: 34 mg/dL (ref 0.0–40.0)

## 2013-05-10 LAB — HEMOGLOBIN A1C: Hgb A1c MFr Bld: 5.6 % (ref 4.6–6.5)

## 2013-05-10 LAB — URIC ACID: Uric Acid, Serum: 5.1 mg/dL (ref 2.4–7.0)

## 2013-05-10 LAB — MAGNESIUM: Magnesium: 2.1 mg/dL (ref 1.5–2.5)

## 2013-05-10 LAB — SEDIMENTATION RATE: SED RATE: 16 mm/h (ref 0–22)

## 2013-05-10 LAB — TSH: TSH: 1.26 u[IU]/mL (ref 0.35–5.50)

## 2013-05-10 NOTE — Patient Instructions (Addendum)
blood tests are being requested for you today.  We'll contact you with results. Please come back for your annual checkup any day after 05/25/13. Please call if you want an x-ray, or to see a specialist for the ankle pain.

## 2013-05-10 NOTE — Progress Notes (Signed)
Subjective:    Patient ID: [name] [name], female    DOB: Dec 21, 1945, 68 y.o.   MRN: [id number]  HPI Pt states 1 month of moderate pain at the right ankle, and assoc swelling.  Past Medical History  Diagnosis Date  . Chronic cystitis   . Hx of adenomatous colonic polyps   . Hyperlipidemia   . Multinodular goiter   . Osteoporosis   . History of Helicobacter pylori infection   . Hypothyroidism   . Anxiety   . GERD (gastroesophageal reflux disease)   . H/O hiatal hernia   . 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 History of chemotherapy     taxoterte/cytoxan 4 cycles completed 09/08/09  . History of hypertension     no meds post chemo 2011 due to hypotention  . History of radiation therapy     right breast cancer  2011  . Iron deficiency anemia     HX IRON INFUSION'S  LAST ONE AUG 2014  . Pelvic prolapse   . Urgency of urination   . Incomplete emptying of bladder   . OSA on CPAP     SLEEP STUDY 07-16-2007  MODERATE OSA    Past Surgical History  Procedure Laterality Date  . Carpal tunnel release Bilateral 2000 (approx)  . Urethral dilation  1970'S  . Cataract extraction w/ intraocular lens  implant, bilateral    . Right breast lumpectomy w/  node dissection  05-29-2009  . Breast lumpectomy Left 04/2009    BENIGN  . Bilateral benign breast bx'  YRS AGO  . Abdominal hysterectomy  1980    W/ RIGHT SALPINGOOPHORECTOMY AND APPENDECTOMY  . Lumbar spine surgery  01-23-2013  . Transthoracic echocardiogram  10-16-2009    MILD LVH/  EF 73-71%/  GRADE I DIASTOLIC DYSFUNCTION  . Pubovaginal sling N/A 02/22/2013    Procedure: BOSTON SCIENTIFIC UPHOLD LITE POSTERIOR VAULT PROLAPSE REPAIR ;  Surgeon: Ailene Rud, MD;  Location[facility];  Service:  Urology;  Laterality: N/A;  . Anterior and posterior repair N/A 02/22/2013    Procedure:  KELLY PLICATION POSTERIOR WITH COLOPLAST TISSUE Velvet Bathe ;  Surgeon: Ailene Rud, MD;  [facility];  Service: Urology;  Laterality: N/A;    History   Social History  . Marital Status: Married    Spouse Name: N/A    Number of Children: 3  . Years of Education: N/A   Occupational History  . Retired    Social History Main Topics  . Smoking status: Never Smoker   . Smokeless tobacco: Never Used  . Alcohol Use: No  . Drug Use: No  . Sexual Activity: Not on file   Other Topics Concern  . Not on file   Social History Narrative   Retired Chartered certified accountant    Current Outpatient Prescriptions on File Prior to Visit  Medication Sig Dispense Refill  . albuterol (PROAIR HFA) 108 (90 BASE) MCG/ACT inhaler Inhale 2 puffs into the lungs every 6 (six) hours as needed for wheezing or shortness of breath.  18 g  5  . Calcium Carbonate-Vitamin D (CALTRATE 600+D) 600-400 MG-UNIT per tablet Take 1 tablet by mouth daily.       Marland Kitchen  ciprofloxacin (CIPRO) 500 MG tablet Take 1 tablet (500 mg total) by mouth 2 (two) times daily.  10 tablet  0  . esomeprazole (NEXIUM) 40 MG capsule TAKE ONE CAPSULE BY MOUTH DAILY   takes inm am      . furosemide (LASIX) 20 MG tablet Take 20 mg by mouth as needed for fluid. Rarely for swelling      . gabapentin (NEURONTIN) 100 MG capsule Take 300 mg by mouth 2 (two) times daily.       Marland Kitchen ibuprofen (ADVIL,MOTRIN) 200 MG tablet Take 200 mg by mouth every 6 (six) hours as needed.      Marland Kitchen levothyroxine (SYNTHROID, LEVOTHROID) 25 MCG tablet Take 25 mcg by mouth daily before breakfast.      . LORazepam (ATIVAN) 0.5 MG tablet TAKE ONE TABLET BY MOUTH EVERY 4 HOURS AS NEEDED  60 tablet  0  . magnesium oxide (MAG-OX) 400 (241.3 MG) MG tablet Take 1 tablet (400 mg total) by mouth 2 (two) times daily.  60 tablet  4  . montelukast (SINGULAIR) 10 MG tablet Take  10 mg by mouth at bedtime.      . naproxen sodium (ANAPROX) 220 MG tablet Take 220 mg by mouth as needed.      . ondansetron (ZOFRAN) 4 MG tablet Take 1 tablet (4 mg total) by mouth every 8 (eight) hours as needed for nausea (may repeat dose in 1 hr if no improvement).  20 tablet  0  . ondansetron (ZOFRAN) 8 MG tablet Take 1 tablet (8 mg total) by mouth every 8 (eight) hours as needed for nausea or vomiting (1/2 tablet).  20 tablet  0  . polyethylene glycol (MIRALAX / GLYCOLAX) packet May use 2 tablespoons in juice or water daily; or 1 packet.  15 each  11  . tamoxifen (NOLVADEX) 20 MG tablet TAKE ONE TABLET BY MOUTH EVERY DAY  90 tablet  1  . traMADol-acetaminophen (ULTRACET) 37.5-325 MG per tablet Take 1 tablet by mouth every 6 (six) hours as needed.  30 tablet  2  . trimethoprim (TRIMPEX) 100 MG tablet Take 1 tablet (100 mg total) by mouth 1 day or 1 dose.  30 tablet  1   No current facility-administered medications on file prior to visit.    Allergies  Allergen Reactions  . Penicillins Rash    Family History  Problem Relation Age of Onset  . Asthma Mother   . Colon cancer Father 25  . Hypertension Sister   . Hypertension Brother   . Ulcerative colitis Son   . Asthma Son   . Stomach cancer Maternal Grandfather   . Thyroid disease Other     Aunt  . Arthritis Other   . Prostate cancer Other     BP 120/80  Pulse 62  Temp(Src) 98.6 F (37 C) (Oral)  Ht 5' 2.5" (1.588 m)  Wt 167 lb (75.751 kg)  BMI 30.04 kg/m2  SpO2 96%  Review of Systems She has hair loss, rhinorrhea, and leg/thigh cramps.  She denies chest pain and sob.      Objective:   Physical Exam VITAL SIGNS:  See vs page.   GENERAL: no distress.  Head: mild diffuse alopecia.  Ext: trace bilat leg edema. Right ankle: slight tenderness, but no erythema and warmth. Gait: favors RLE    Lab Results  Component Value Date   WBC 9.8 03/19/2013   HGB 11.6 03/19/2013   HCT 35.2 03/19/2013   PLT 250 03/19/2013  GLUCOSE 132 03/19/2013   CHOL 156 05/10/2013   TRIG 170.0* 05/10/2013   HDL 55.90 05/10/2013   LDLDIRECT 73.4 05/25/2012   LDLCALC 66 05/10/2013   ALT 30 03/19/2013   AST 29 03/19/2013   NA 142 03/19/2013   K 3.5 03/19/2013   CL 103 02/23/2013   CREATININE 0.7 03/19/2013   BUN 9.5 03/19/2013   CO2 28 03/19/2013   TSH 1.26 05/10/2013   HGBA1C 5.6 05/10/2013      Assessment & Plan:  Hypothyroidism: well-replaced Hair loss: not thyroid-related Ankle pain, uncertain etiology.   Pr declines ref to a specialist now.

## 2013-05-11 LAB — PTH, INTACT AND CALCIUM
Calcium: 9.1 mg/dL (ref 8.4–10.5)
PTH: 51.6 pg/mL (ref 14.0–72.0)

## 2013-05-11 LAB — VITAMIN D 25 HYDROXY (VIT D DEFICIENCY, FRACTURES): Vit D, 25-Hydroxy: 33 ng/mL (ref 30–89)

## 2013-05-25 ENCOUNTER — Ambulatory Visit (INDEPENDENT_AMBULATORY_CARE_PROVIDER_SITE_OTHER): Payer: Medicare Other | Admitting: Neurology

## 2013-05-25 ENCOUNTER — Encounter: Payer: Self-pay | Admitting: Neurology

## 2013-05-25 VITALS — BP 120/68 | HR 86 | Wt 170.1 lb

## 2013-05-25 DIAGNOSIS — T451X5A Adverse effect of antineoplastic and immunosuppressive drugs, initial encounter: Secondary | ICD-10-CM

## 2013-05-25 DIAGNOSIS — G620 Drug-induced polyneuropathy: Secondary | ICD-10-CM

## 2013-05-25 NOTE — Patient Instructions (Signed)
1.  Increase neurontin as follows:    PM  Week 1:   300mg   Week 2: 400mg   Week 3: 500mg  2.  Call me with an update in 3-4 weeks 3.  Try applying vicks vaporub to soles of feet 4.  Return to clinic 25-month

## 2013-05-25 NOTE — Progress Notes (Signed)
[facility] Clinic Note - Initial Visit   Date: 05/25/2013    [name] MRN: [id number] DOB: 1945-10-14   Dear Dr Jabier Gauss:  Thank you for your kind referral of [name] [name] for consultation of paresthesias of her feet. Although her history is well known to you, please allow [place] to reiterate it for the purpose of our medical record. The patient was accompanied to the clinic by self.     History of Present Illness: [name] [name] is a 68 y.o. 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    Starting in 2011, she was diagnosed with breast cancer and was started on chemotherapy with docetaxel and cytoxan.  Following radiation and chemotherapy, she developed numbness and burning of the soles of her feet, worse on the right side.  Pain is always worse in the evenings when she tries to rest, but also exacerbated by prolonged standing.  She was started on neurontin $RemoveBefo'200mg'JuMCruzOxlu$  twice daily, without any significant benefit.  She is not having any side effects with it. No other neuralgesic medication has been tried.  She is still able to function through the pain and is more concerned whether she should even be taking the Neurontin given its marginal benefit.  She has mild numbness of the right side of the hand and fingers, worse at night time, which is improved by repositioning.   Out-side paper records, electronic medical record, and images have been reviewed where available and summarized as:  Lab Results  Component Value Date   TSH 1.26 05/10/2013   Lab Results  Component Value Date   HGBA1C 5.6 05/10/2013   Lab Results  Component Value Date   ESRSEDRATE 16 05/10/2013   Lab Results  Component Value Date   VITAMINB12  >1500* 08/26/2012   Lab Results  Component Value Date   HGBA1C 5.6 05/10/2013      Past Medical History  Diagnosis Date  . Chronic cystitis   . Hx of adenomatous colonic polyps   . Hyperlipidemia   . Multinodular goiter   . Osteoporosis   . History of Helicobacter pylori infection   . Hypothyroidism   . Anxiety   . GERD (gastroesophageal reflux disease)   . H/O hiatal hernia   . 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 History of chemotherapy     taxoterte/cytoxan 4 cycles completed 09/08/09  . History of hypertension     no meds post chemo 2011 due to hypotention  . History of radiation therapy     right breast cancer  2011  . Iron deficiency anemia     HX IRON INFUSION'S  LAST ONE AUG 2014  . Pelvic prolapse   . Urgency of urination   . Incomplete emptying of bladder   . OSA on CPAP     SLEEP STUDY 07-16-2007  MODERATE OSA    Past Surgical History  Procedure Laterality Date  . Carpal tunnel release Bilateral 2000 (approx)  . Urethral dilation  1970'S  . Cataract extraction w/ intraocular lens  implant, bilateral    . Right breast lumpectomy w/  node dissection  05-29-2009  . Breast lumpectomy Left 04/2009    BENIGN  . Bilateral benign breast bx'  YRS AGO  . Abdominal hysterectomy  1980    W/ RIGHT SALPINGOOPHORECTOMY AND APPENDECTOMY  . Lumbar spine surgery  01-23-2013  . Transthoracic echocardiogram  10-16-2009    MILD LVH/  EF 76-19%/  GRADE I DIASTOLIC DYSFUNCTION  . Pubovaginal sling N/A 02/22/2013    Procedure: BOSTON SCIENTIFIC UPHOLD LITE POSTERIOR VAULT PROLAPSE REPAIR ;  Surgeon: Ailene Rud, MD;  Location: [facility];  Service: Urology;  Laterality: N/A;  . Anterior and posterior repair N/A 02/22/2013    Procedure:  KELLY  PLICATION POSTERIOR WITH COLOPLAST TISSUE Velvet Bathe ;  Surgeon: Ailene Rud, MD;  Location: [facility];  Service: Urology;  Laterality: N/A;     Medications:  Current Outpatient Prescriptions on File Prior to Visit  Medication Sig Dispense Refill  . albuterol (PROAIR HFA) 108 (90 BASE) MCG/ACT inhaler Inhale 2 puffs into the lungs every 6 (six) hours as needed for wheezing or shortness of breath.  18 g  5  . Calcium Carbonate-Vitamin D (CALTRATE 600+D) 600-400 MG-UNIT per tablet Take 1 tablet by mouth daily.       Marland Kitchen esomeprazole (NEXIUM) 40 MG capsule TAKE ONE CAPSULE BY MOUTH DAILY   takes inm am      . furosemide (LASIX) 20 MG tablet Take 20 mg by mouth as needed for fluid. Rarely for swelling      . gabapentin (NEURONTIN) 100 MG capsule Take 300 mg by mouth 2 (two) times daily.       Marland Kitchen ibuprofen (ADVIL,MOTRIN) 200 MG tablet Take 200 mg by mouth every 6 (six) hours as needed.      Marland Kitchen levothyroxine (SYNTHROID, LEVOTHROID) 25 MCG tablet Take 25 mcg by mouth daily before breakfast.      . LORazepam (ATIVAN) 0.5 MG tablet TAKE ONE TABLET BY MOUTH EVERY 4 HOURS AS NEEDED  60 tablet  0  . magnesium oxide (MAG-OX) 400 (241.3 MG) MG tablet Take 1 tablet (400 mg total) by mouth 2 (two) times daily.  60 tablet  4  . montelukast (SINGULAIR) 10 MG tablet Take 10 mg by mouth at bedtime.      . ondansetron (ZOFRAN) 4 MG tablet Take 1 tablet (4 mg total) by mouth every 8 (eight) hours as needed for nausea (may repeat dose in 1 hr if no improvement).  20 tablet  0  . tamoxifen (NOLVADEX) 20 MG tablet TAKE ONE TABLET BY MOUTH EVERY DAY  90 tablet  1  . traMADol-acetaminophen (ULTRACET) 37.5-325 MG per tablet Take 1 tablet by mouth every 6 (six) hours as needed.  30 tablet  2   No current facility-administered medications on file prior to visit.    Allergies:  Allergies  Allergen Reactions  . Penicillins Rash    Family History: Family History  Problem Relation Age of Onset    . Asthma Mother   . Colon cancer Father 53  . Hypertension Sister   . Hypertension Brother   . Ulcerative colitis Son   . Asthma Son   . Stomach cancer Maternal Grandfather   . Thyroid disease Other     Aunt  . Arthritis Other   . Prostate cancer Other     Social History: History   Social History  . Marital Status: Married    Spouse Name:  N/A    Number of Children: 3  . Years of Education: N/A   Occupational History  . Retired    Social History Main Topics  . Smoking status: Never Smoker   . Smokeless tobacco: Never Used  . Alcohol Use: No  . Drug Use: No  . Sexual Activity: Not on file   Other Topics Concern  . Not on file   Social History Narrative   Retired Chartered certified accountant    Review of Systems:  CONSTITUTIONAL: No fevers, chills, night sweats, or weight loss.   EYES: No visual changes or eye pain ENT: No hearing changes.  No history of nose bleeds.   RESPIRATORY: No cough, wheezing and shortness of breath.   CARDIOVASCULAR: Negative for chest pain, and palpitations.   GI: Negative for abdominal discomfort, blood in stools or black stools.  No recent change in bowel habits.   GU:  No history of incontinence.   MUSCLOSKELETAL: +history of joint pain or swelling.  No myalgias.   SKIN: Negative for lesions, rash, and itching.   HEMATOLOGY/ONCOLOGY: Negative for prolonged bleeding, bruising easily, and swollen nodes.   ENDOCRINE: Negative for cold or heat intolerance, polydipsia or goiter.   PSYCH:  No depression or anxiety symptoms.   NEURO: As Above.   Vital Signs:  BP 120/68  Pulse 86  Wt 170 lb 1 oz (77.14 kg)  SpO2 96%  Neurological Exam: MENTAL STATUS including orientation to time, place, person, recent and remote memory, attention span and concentration, language, and fund of knowledge is normal.  Speech is not dysarthric.  CRANIAL NERVES: II:  No visual field defects.  Unremarkable fundi.   III-IV-VI: Pupils equal round and reactive to  light.  Normal conjugate, extra-ocular eye movements in all directions of gaze.  No nystagmus.  No ptosis.   V:  Normal facial sensation.   VII:  Normal facial symmetry and movements.   VIII:  Normal hearing and vestibular function.   IX-X:  Normal palatal movement.   XI:  Normal shoulder shrug and head rotation.   XII:  Normal tongue strength and range of motion, no deviation or fasciculation.  MOTOR:  No atrophy, fasciculations or abnormal movements.  No pronator drift.  Tone is normal.    Right Upper Extremity:    Left Upper Extremity:    Deltoid  5/5   Deltoid  5/5   Biceps  5/5   Biceps  5/5   Triceps  5/5   Triceps  5/5   Wrist extensors  5/5   Wrist extensors  5/5   Wrist flexors  5/5   Wrist flexors  5/5   Finger extensors  5/5   Finger extensors  5/5   Finger flexors  5/5   Finger flexors  5/5   Dorsal interossei  5/5   Dorsal interossei  5/5   Abductor pollicis  5/5   Abductor pollicis  5/5   Tone (Ashworth scale)  0  Tone (Ashworth scale)  0   Right Lower Extremity:    Left Lower Extremity:    Hip flexors  5/5   Hip flexors  5/5   Hip extensors  5/5   Hip extensors  5/5   Knee flexors  5/5   Knee flexors  5/5   Knee extensors  5/5   Knee extensors  5/5   Dorsiflexors  5/5   Dorsiflexors  5/5   Plantarflexors  5/5   Plantarflexors  5/5   Toe extensors  5/5  Toe extensors  5/5   Toe flexors  5/5   Toe flexors  5/5   Tone (Ashworth scale)  0  Tone (Ashworth scale)  0   MSRs:  Right                                                                 Left brachioradialis 2+  brachioradialis 2+  biceps 2+  biceps 2+  triceps 2+  triceps 2+  patellar 2+  patellar 2+  ankle jerk 2+  ankle jerk 2+  Hoffman no  Hoffman no  plantar response down  plantar response up   SENSORY:  Vibration is markedly reduced at the great toe and pin prick is reduced distal to the ankles bilaterally.  Light touch and proprioception is intact.  Romberg's sign mildly  positive.  COORDINATION/GAIT: Normal finger-to- nose-finger and heel-to-shin.  Intact rapid alternating movements bilaterally.  Able to rise from a chair without using arms.  Gait narrow based and stable. Tandem and stressed gait intact.    IMPRESSION/PLAN: Mrs. Szczepanik is a delightful 68 year old female presenting for evaluation of bilateral feet paresthesias. 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  Based on her history and exam, most likely etiology is from docetaxel.  Fortunately, there has been no significant worsening of symptoms since onset.  I have reviewed her labs for secondary causes of neuropathy which have been unrevealing.  I explained that she will have some permanent degree of neuropathy, but that nerves may recover in a very slow and gradual manner.  I do not feel strongly that EMG is needed, since her history and exam is very typical.  For symptomatic benefit, I would like her to further titrate her neuontin as the dose may suboptimal.  I will start her on Neurontin 300 mg at bedtime and increase by 100 mg each week, to a dose that she can tolerate and has benefit. She has been instructed to call me with an update in 2-4 weeks, so I can instruct her regarding ongoing titration.  I will see her back in 2 months or sooner as needed.   The duration of this appointment visit was 45 minutes of face-to-face time with the patient.  Greater than 50% of this time was spent in counseling, explanation of diagnosis, planning of further management, and coordination of care.   Thank you for allowing me to participate in patient's care.  If I can answer any additional questions, I would be pleased to do so.    Sincerely,    Cristina Mattern K. Posey Pronto, DO

## 2013-06-23 ENCOUNTER — Encounter: Payer: Self-pay | Admitting: Internal Medicine

## 2013-07-13 ENCOUNTER — Encounter: Payer: Self-pay | Admitting: Internal Medicine

## 2013-07-15 ENCOUNTER — Other Ambulatory Visit: Payer: Self-pay | Admitting: Internal Medicine

## 2013-07-22 ENCOUNTER — Telehealth: Payer: Self-pay | Admitting: *Deleted

## 2013-07-22 NOTE — Telephone Encounter (Signed)
Patient left a voice message for a return phone call. Patient didn't state the purpose of the call. Patient wasn't at home. This RN spoke with husband and he will give the message to his wife.

## 2013-07-29 ENCOUNTER — Telehealth: Payer: Self-pay | Admitting: *Deleted

## 2013-07-29 DIAGNOSIS — IMO0001 Reserved for inherently not codable concepts without codable children: Secondary | ICD-10-CM

## 2013-07-29 DIAGNOSIS — D509 Iron deficiency anemia, unspecified: Secondary | ICD-10-CM

## 2013-07-29 NOTE — Telephone Encounter (Signed)
Message left by pt this am from patient requesting a return call to [phone].  This RN returned call to above and spoke with pt.   [name] [name] states " I am having the same symptoms as last year - very tired all the time and just want to sleep a lot "  " last year you gave me an iron injection and that helped a lot "  Per discussion [name] [name] will come in am for lab to evaluate iron status. This RN asked her to call on Monday for results and appropriate recommendations.

## 2013-07-30 ENCOUNTER — Other Ambulatory Visit: Payer: Medicare Other

## 2013-07-30 ENCOUNTER — Telehealth: Payer: Self-pay | Admitting: Oncology

## 2013-07-30 ENCOUNTER — Telehealth: Payer: Self-pay | Admitting: Neurology

## 2013-07-30 ENCOUNTER — Ambulatory Visit: Payer: Medicare Other | Admitting: Neurology

## 2013-07-30 NOTE — Telephone Encounter (Signed)
pt cld to chge appt to 7/13 for lab only-gavee pt new time-pt understood

## 2013-07-30 NOTE — Telephone Encounter (Signed)
Pt cancelled 2 month follow up scheduled for 07/30/13 w/ Dr. Posey Pronto, stating he will be out of town. To date, he has not rescheduled appt / Sherri S.

## 2013-08-02 ENCOUNTER — Other Ambulatory Visit (HOSPITAL_BASED_OUTPATIENT_CLINIC_OR_DEPARTMENT_OTHER): Payer: Medicare Other

## 2013-08-02 DIAGNOSIS — D509 Iron deficiency anemia, unspecified: Secondary | ICD-10-CM

## 2013-08-02 DIAGNOSIS — IMO0001 Reserved for inherently not codable concepts without codable children: Secondary | ICD-10-CM

## 2013-08-02 LAB — CBC WITH DIFFERENTIAL/PLATELET
BASO%: 0.7 % (ref 0.0–2.0)
BASOS ABS: 0.1 10*3/uL (ref 0.0–0.1)
EOS ABS: 0.3 10*3/uL (ref 0.0–0.5)
EOS%: 3.4 % (ref 0.0–7.0)
HCT: 37.6 % (ref 34.8–46.6)
HEMOGLOBIN: 12.2 g/dL (ref 11.6–15.9)
LYMPH#: 3.1 10*3/uL (ref 0.9–3.3)
LYMPH%: 36.2 % (ref 14.0–49.7)
MCH: 32.6 pg (ref 25.1–34.0)
MCHC: 32.5 g/dL (ref 31.5–36.0)
MCV: 100.4 fL (ref 79.5–101.0)
MONO#: 0.9 10*3/uL (ref 0.1–0.9)
MONO%: 10.4 % (ref 0.0–14.0)
NEUT%: 49.3 % (ref 38.4–76.8)
NEUTROS ABS: 4.2 10*3/uL (ref 1.5–6.5)
PLATELETS: 225 10*3/uL (ref 145–400)
RBC: 3.74 10*6/uL (ref 3.70–5.45)
RDW: 13.5 % (ref 11.2–14.5)
WBC: 8.4 10*3/uL (ref 3.9–10.3)

## 2013-08-02 LAB — IRON AND TIBC CHCC
%SAT: 37 % (ref 21–57)
Iron: 89 ug/dL (ref 41–142)
TIBC: 243 ug/dL (ref 236–444)
UIBC: 154 ug/dL (ref 120–384)

## 2013-08-02 LAB — FERRITIN CHCC: Ferritin: 727 ng/ml — ABNORMAL HIGH (ref 9–269)

## 2013-08-03 ENCOUNTER — Other Ambulatory Visit: Payer: Self-pay | Admitting: Oncology

## 2013-08-03 DIAGNOSIS — C50919 Malignant neoplasm of unspecified site of unspecified female breast: Secondary | ICD-10-CM

## 2013-08-03 DIAGNOSIS — G589 Mononeuropathy, unspecified: Secondary | ICD-10-CM

## 2013-08-05 ENCOUNTER — Ambulatory Visit: Payer: Medicare Other

## 2013-08-06 ENCOUNTER — Telehealth: Payer: Self-pay

## 2013-08-06 ENCOUNTER — Ambulatory Visit: Payer: Medicare Other

## 2013-08-06 NOTE — Telephone Encounter (Signed)
Ok, i added it to med list

## 2013-08-06 NOTE — Telephone Encounter (Signed)
Pt came to office yesterday requesting a B 12 shot. Pt last received a B-12 on 08/26/2012 from Dr. Jenny Reichmann. Pt states that she has been felling tired like she was one when she first received the injection and would like receive another one. Please advise, Thanks!

## 2013-08-06 NOTE — Telephone Encounter (Signed)
Pt coming for injection this afternoon.

## 2013-08-18 ENCOUNTER — Telehealth: Payer: Self-pay | Admitting: Endocrinology

## 2013-08-18 ENCOUNTER — Other Ambulatory Visit: Payer: Self-pay | Admitting: Internal Medicine

## 2013-08-18 NOTE — Telephone Encounter (Signed)
Patient is asking could she get her B 12 injection at this office, Please advise.  Thank you

## 2013-08-18 NOTE — Telephone Encounter (Signed)
Pt coming at 11 pm 7/30 for injection.

## 2013-08-19 ENCOUNTER — Ambulatory Visit: Payer: Medicare Other

## 2013-08-20 ENCOUNTER — Ambulatory Visit: Payer: Medicare Other | Admitting: Internal Medicine

## 2013-08-20 DIAGNOSIS — E538 Deficiency of other specified B group vitamins: Secondary | ICD-10-CM

## 2013-08-20 MED ORDER — CYANOCOBALAMIN 1000 MCG/ML IJ SOLN: 1000.0000 ug | Freq: Once | INTRAMUSCULAR | Status: DC

## 2013-08-20 MED ORDER — CYANOCOBALAMIN 1000 MCG/ML IJ SOLN
1000.0000 ug | Freq: Once | INTRAMUSCULAR | Status: AC
  Administered 2013-08-20: 1000 ug via INTRAMUSCULAR

## 2013-08-25 ENCOUNTER — Telehealth: Payer: Self-pay | Admitting: Pulmonary Disease

## 2013-08-25 NOTE — Telephone Encounter (Signed)
Called pharmacy and cancelled rx. Pt advised that rx was sent in error.

## 2013-08-25 NOTE — Telephone Encounter (Signed)
Called spoke with pt. She used to see [facility] for her OSA-looks like last seen 2011 for this. She saw [facility] 04/06/12 for acute visit for MR for pulm reason. Pt reports she needs her CPAP machine downloaded bc she is feeling tired all the time. Please advise Dr. Gwenette Greet if pt can be scheduled to see you for rouotine ROV or if she needs to be a new patient for her sleep apnea? thanks

## 2013-08-25 NOTE — Telephone Encounter (Signed)
Patient stated that pharmacy called her and said she has a pick up for b-12 vial, she stated she didn't know anything about it,  should she go and pick it up, Please advise

## 2013-08-25 NOTE — Telephone Encounter (Signed)
I don't mind seeing her as a regular OV, and she needs to bring her machine with power cord to the visit so we can download.  Make sure she has a resmed machine and not a respironics.

## 2013-08-26 NOTE — Telephone Encounter (Signed)
Pt schd follow up appt on 8/13. Pt is aware to bring resmed machine with power cord.  Nothing further needed at this time.

## 2013-09-02 ENCOUNTER — Encounter: Payer: Self-pay | Admitting: Pulmonary Disease

## 2013-09-02 ENCOUNTER — Ambulatory Visit (INDEPENDENT_AMBULATORY_CARE_PROVIDER_SITE_OTHER): Payer: Medicare Other | Admitting: Pulmonary Disease

## 2013-09-02 VITALS — BP 118/78 | HR 64 | Temp 98.2°F | Ht 62.5 in | Wt 171.0 lb

## 2013-09-02 DIAGNOSIS — G4733 Obstructive sleep apnea (adult) (pediatric): Secondary | ICD-10-CM

## 2013-09-02 NOTE — Assessment & Plan Note (Signed)
The patient is having increased daytime sleepiness and fatigue as well as nonrestorative sleep, and her downloaded shows breakthrough apneas with significant mask leak. The patient also feels this is a problem. I would like to refer her to the sleep Center for a mask fitting, and see how she responds. If she is continuing to have issues, would get a no other download from her device to see if she is still having breakthrough apnea. If so, she may need increased pressure. If her new download after getting a new mask appears fine, and I suspect her current symptoms are due to to something else other than her sleep apnea.

## 2013-09-02 NOTE — Patient Instructions (Signed)
Will refer to sleep center for a mask fitting, then let's see if your symptoms improve.  If they do not, let me know, and I would then get another download to see if you are still having breakthru apnea.  If you are, we can make adjustments to the pressure.  If you are not still having apnea, then your symptoms are due to something else beside your sleep apnea.  If doing well, followup with me in one year for your sleep apnea.

## 2013-09-02 NOTE — Progress Notes (Signed)
   Subjective:    Patient ID: [name] [name], female    DOB: [birth year], 68 y.o.   MRN: [id number]  HPI The patient comes in today after a very long hiatus for followup of her obstructive sleep apnea. She tells me that she has been wearing the device compliantly, but is having worsening fatigue during the day and nonrestorative sleep despite wearing her device compliantly. She is complaining of increasing mask leak despite replacing the cushions. Her download today shows breakthrough apnea, and as well as significant mask leak above the usual threshold. She is wearing the device compliantly.   Review of Systems  Constitutional: Positive for fatigue. Negative for fever and unexpected weight change.  HENT: Negative for congestion, dental problem, ear pain, nosebleeds, postnasal drip, rhinorrhea, sinus pressure, sneezing, sore throat and trouble swallowing.   Eyes: Negative for redness and itching.  Respiratory: Negative for cough, chest tightness, shortness of breath and wheezing.   Cardiovascular: Negative for palpitations and leg swelling.  Gastrointestinal: Negative for nausea and vomiting.  Genitourinary: Negative for dysuria.  Musculoskeletal: Negative for joint swelling.  Skin: Negative for rash.  Neurological: Negative for headaches.  Hematological: Does not bruise/bleed easily.  Psychiatric/Behavioral: Negative for dysphoric mood. The patient is not nervous/anxious.        Objective:   Physical Exam Overweight female in no acute distress Nose without purulence or discharge noted No skin breakdown or pressure necrosis from the CPAP mask Neck without lymphadenopathy or thyromegaly Lower extremities without significant edema, no cyanosis Alert and oriented, moves all 4 extremities.       Assessment & Plan:

## 2013-09-07 ENCOUNTER — Other Ambulatory Visit (HOSPITAL_BASED_OUTPATIENT_CLINIC_OR_DEPARTMENT_OTHER): Payer: Medicare Other

## 2013-09-13 ENCOUNTER — Other Ambulatory Visit (HOSPITAL_BASED_OUTPATIENT_CLINIC_OR_DEPARTMENT_OTHER): Payer: Medicare Other

## 2013-09-14 ENCOUNTER — Ambulatory Visit: Payer: Medicare Other | Admitting: Endocrinology

## 2013-09-14 ENCOUNTER — Encounter: Payer: Self-pay | Admitting: Endocrinology

## 2013-09-14 ENCOUNTER — Ambulatory Visit (INDEPENDENT_AMBULATORY_CARE_PROVIDER_SITE_OTHER): Payer: Medicare Other | Admitting: Endocrinology

## 2013-09-14 ENCOUNTER — Ambulatory Visit: Payer: Medicare Other

## 2013-09-14 VITALS — BP 132/74 | HR 70 | Temp 98.2°F | Ht 62.5 in | Wt 173.0 lb

## 2013-09-14 DIAGNOSIS — T83511A Infection and inflammatory reaction due to indwelling urethral catheter, initial encounter: Secondary | ICD-10-CM

## 2013-09-14 DIAGNOSIS — B3749 Other urogenital candidiasis: Secondary | ICD-10-CM

## 2013-09-14 DIAGNOSIS — M2550 Pain in unspecified joint: Secondary | ICD-10-CM

## 2013-09-14 DIAGNOSIS — E538 Deficiency of other specified B group vitamins: Secondary | ICD-10-CM

## 2013-09-14 DIAGNOSIS — N390 Urinary tract infection, site not specified: Secondary | ICD-10-CM

## 2013-09-14 DIAGNOSIS — R0600 Dyspnea, unspecified: Secondary | ICD-10-CM

## 2013-09-14 DIAGNOSIS — Z09 Encounter for follow-up examination after completed treatment for conditions other than malignant neoplasm: Secondary | ICD-10-CM

## 2013-09-14 LAB — URINALYSIS, ROUTINE W REFLEX MICROSCOPIC
Bilirubin Urine: NEGATIVE
KETONES UR: NEGATIVE
NITRITE: NEGATIVE
Total Protein, Urine: NEGATIVE
UROBILINOGEN UA: 0.2 (ref 0.0–1.0)
Urine Glucose: NEGATIVE
pH: 6 (ref 5.0–8.0)

## 2013-09-14 LAB — BRAIN NATRIURETIC PEPTIDE: Pro B Natriuretic peptide (BNP): 21 pg/mL (ref 0.0–100.0)

## 2013-09-14 MED ORDER — MONTELUKAST SODIUM 10 MG PO TABS: 10.0000 mg | ORAL_TABLET | Freq: Every day | ORAL | Status: DC

## 2013-09-14 MED ORDER — ALBUTEROL SULFATE HFA 108 (90 BASE) MCG/ACT IN AERS: 2.0000 | INHALATION_SPRAY | Freq: Four times a day (QID) | RESPIRATORY_TRACT | Status: DC | PRN

## 2013-09-14 NOTE — Patient Instructions (Addendum)
Please come in soon for a regular physical. blood tests are being requested for you today.  We'll contact you with results.

## 2013-09-14 NOTE — Progress Notes (Signed)
 Subjective:    Patient ID: [name] [name], female    DOB: 07/20/1945, 68 y.o.   MRN: [id number]  HPI Pt says she got B-12 injections as a child.  She has intermittently received B-12 injections over many years here, but has never had a low B-12 level here.  She has fatigue.  She received a B-12 injection here last month, and says she felt much better.  However, sxs have recurred.   Asthma: singulair and proventil help this.   She just finished antibiotic for uti, rx'ed by urology, which she took x 3 months.  Denies dysuria.  Past Medical History  Diagnosis Date  . Chronic cystitis   . Hx of adenomatous colonic polyps   . Hyperlipidemia   . Multinodular goiter   . Osteoporosis   . History of Helicobacter pylori infection   . Hypothyroidism   . Anxiety   . GERD (gastroesophageal reflux disease)   . H/O hiatal hernia   . 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 History of chemotherapy     taxoterte/cytoxan 4 cycles completed 09/08/09  . History of hypertension     no meds post chemo 2011 due to hypotention  . History of radiation therapy     right breast cancer  2011  . Iron deficiency anemia     HX IRON INFUSION'S  LAST ONE AUG 2014  . Pelvic prolapse   . Urgency of urination   . Incomplete emptying of bladder   . OSA on CPAP     SLEEP STUDY 07-16-2007  MODERATE OSA    Past Surgical History  Procedure Laterality Date  . Carpal tunnel release Bilateral 2000 (approx)  . Urethral dilation  1970'S  . Cataract extraction w/ intraocular lens  implant, bilateral    . Right breast lumpectomy w/  node dissection  05-29-2009  . Breast lumpectomy Left 04/2009    BENIGN  . Bilateral benign breast bx'  YRS AGO  . Abdominal hysterectomy  1980    W/ RIGHT SALPINGOOPHORECTOMY  AND APPENDECTOMY  . Lumbar spine surgery  01-23-2013  . Transthoracic echocardiogram  10-16-2009    MILD LVH/  EF 65-70%/  GRADE I DIASTOLIC DYSFUNCTION  . Pubovaginal sling N/A 02/22/2013    Procedure: BOSTON SCIENTIFIC UPHOLD LITE POSTERIOR VAULT PROLAPSE REPAIR ;  Surgeon: Sigmund I Tannenbaum, MD;  Location: [facility];  Service: Urology;  Laterality: N/A;  . Anterior and posterior repair N/A 02/22/2013    Procedure:  KELLY PLICATION POSTERIOR WITH COLOPLAST TISSUE SOLYX SLING ;  Surgeon: Sigmund I Tannenbaum, MD;  Location:  SURGERY CENTER;  Service: Urology;  Laterality: N/A;    History   Social History  . Marital Status: Married    Spouse Name: N/A    Number of Children: 3  . Years of Education: N/A   Occupational History  . Retired    Social History Main Topics  . Smoking status: Never Smoker   . Smokeless tobacco: Never Used  . Alcohol Use: No  . Drug Use: No  . Sexual Activity: Not on file   Other Topics Concern  . 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 Calcium Carbonate-Vitamin D (CALTRATE 600+D) 600-400 MG-UNIT per tablet Take 1 tablet by mouth daily.       . EUFLEXXA 20 MG/2ML SOSY Inject as directed. Every 2 years.      . furosemide (LASIX) 20 MG tablet Take 20 mg by mouth as needed for fluid. Rarely for swelling      . gabapentin (NEURONTIN) 100 MG capsule TAKE TWO CAPSULES BY MOUTH THREE TIMES DAILY  540 capsule  2  . ibuprofen (ADVIL,MOTRIN) 200 MG tablet Take 200 mg by mouth every 6 (six) hours as needed.      . levothyroxine (SYNTHROID, LEVOTHROID) 25 MCG tablet Take 25 mcg by mouth daily before breakfast.      . LORazepam (ATIVAN) 0.5 MG tablet TAKE ONE TABLET BY MOUTH EVERY 4 HOURS AS NEEDED  60 tablet  0  . tamoxifen (NOLVADEX) 20 MG tablet TAKE ONE TABLET BY MOUTH EVERY DAY  90 tablet  1   No current  facility-administered medications on file prior to visit.    Allergies  Allergen Reactions  . Penicillins Rash    Family History  Problem Relation Age of Onset  . Asthma Mother   . Colon cancer Father 82  . Hypertension Sister   . Hypertension Brother   . Ulcerative colitis Son   . Asthma Son   . Stomach cancer Maternal Grandfather   . Thyroid disease Other     Aunt  . Arthritis Other   . Prostate cancer Other     BP 132/74  Pulse 70  Temp(Src) 98.2 F (36.8 C) (Oral)  Ht 5' 2.5" (1.588 m)  Wt 173 lb (78.472 kg)  BMI 31.12 kg/m2  SpO2 97%   Review of Systems Denies cough.  She has intermittent slight doe and arthralgias.      Objective:   Physical Exam VITAL SIGNS:  See vs page GENERAL: no distress LUNGS:  Clear to auscultation Ext: trace bilat leg edema.   UA: pyuria B-12=normal (i reviewed spirometry result)    Assessment & Plan:  Pyuria, improved.  Pt is advised to f/u with urology. asthma: well-controlled. Fatigue: not related to B-12 deficiency.  i've requested paper chart, to see if we have ever noted low B-12 level.      Patient is advised the following: Patient Instructions  Please come in soon for a regular physical. blood tests are being requested for you today.  We'll contact you with results.     

## 2013-09-15 ENCOUNTER — Other Ambulatory Visit: Payer: Self-pay | Admitting: Internal Medicine

## 2013-09-15 LAB — VITAMIN B12: Vitamin B-12: 546 pg/mL (ref 211–911)

## 2013-09-15 LAB — URIC ACID: Uric Acid, Serum: 4.8 mg/dL (ref 2.4–7.0)

## 2013-09-17 ENCOUNTER — Telehealth: Payer: Self-pay | Admitting: Endocrinology

## 2013-09-17 NOTE — Telephone Encounter (Signed)
Labs faxed to office

## 2013-09-17 NOTE — Telephone Encounter (Signed)
Patient stated that she need her lab results sent to Dr Darrick Penna office.

## 2013-09-20 ENCOUNTER — Ambulatory Visit: Payer: Medicare Other

## 2013-09-24 ENCOUNTER — Encounter: Payer: Medicare Other | Admitting: Internal Medicine

## 2013-09-30 ENCOUNTER — Telehealth: Payer: Self-pay | Admitting: Internal Medicine

## 2013-09-30 NOTE — Telephone Encounter (Signed)
Spoke with patient and verified that she is due for EGD.

## 2013-10-03 ENCOUNTER — Other Ambulatory Visit: Payer: Self-pay | Admitting: Endocrinology

## 2013-10-04 NOTE — Telephone Encounter (Signed)
Please advise if ok to refill medication is not on current med list. Thanks!

## 2013-10-05 ENCOUNTER — Encounter: Payer: Self-pay | Admitting: Internal Medicine

## 2013-10-12 ENCOUNTER — Ambulatory Visit: Payer: Medicare Other

## 2013-10-12 ENCOUNTER — Ambulatory Visit (INDEPENDENT_AMBULATORY_CARE_PROVIDER_SITE_OTHER): Payer: Medicare Other | Admitting: *Deleted

## 2013-10-12 DIAGNOSIS — Z23 Encounter for immunization: Secondary | ICD-10-CM

## 2013-10-13 ENCOUNTER — Telehealth: Payer: Self-pay | Admitting: Pulmonary Disease

## 2013-10-13 DIAGNOSIS — G4733 Obstructive sleep apnea (adult) (pediatric): Secondary | ICD-10-CM

## 2013-10-13 NOTE — Telephone Encounter (Signed)
Called spoke with pt. She is requesting an order to get a new CPAP machine ordered. She does not feel like her machine is as effective as before. She reports she was told by vernon at the sleep lab, he thought she needed new machine since her looked old. Please advise [place] thanks

## 2013-10-15 NOTE — Telephone Encounter (Signed)
Spoke with [name] [name] at the [facility] showing a recent appt or correlation with [name] [name] at Sleep lab Per [name] [name], the patient has cancelled/No showed last appts for [facility] -- was supposed to be seen 08/2013  Did pt have a recent visit with [name] [name] and it just not get documented in computer?? [name] [name] out of office until Monday  Called and LM for pt to return our call-- if pt has not recently seen [name] [name], will need to contact [facility] to reschedule appt with [name] [name] [phone])

## 2013-10-15 NOTE — Telephone Encounter (Signed)
Per [facility], [facility] called & states pt needs a new cpap & would like to get a new mask.  Pt would like the kind of mask [name] gave her at the sleep center.  Please send an order to [facility] to help facilitate this for pt.  [name] [name]

## 2013-10-19 ENCOUNTER — Ambulatory Visit (AMBULATORY_SURGERY_CENTER): Payer: Self-pay | Admitting: *Deleted

## 2013-10-19 VITALS — Ht 62.5 in | Wt 175.0 lb

## 2013-10-19 DIAGNOSIS — K2270 Barrett's esophagus without dysplasia: Secondary | ICD-10-CM

## 2013-10-19 NOTE — Telephone Encounter (Signed)
lmtcb with pt's husband Joellen Jersey

## 2013-10-19 NOTE — Progress Notes (Signed)
No allergies to eggs or soy. No problems with anesthesia.  Pt given Emmi instructions for EGD  No oxygen use  No diet drug use  

## 2013-10-19 NOTE — Telephone Encounter (Signed)
Spoke with [name] [name] Pt just dropped by one day and wanted to be fitted with a mask. [name] [name] states that he fitted her with a [employer] small size [name] [name] states that he spoke with [name] yesterday and this was "handled" already. Will send to [name] to figure out what all has been done with this patient and mask request.

## 2013-10-20 NOTE — Telephone Encounter (Signed)
Need to know where we are with all of this. Someone needs to document.

## 2013-10-20 NOTE — Telephone Encounter (Signed)
lmtcb Sally E Ottinger ° °

## 2013-10-25 ENCOUNTER — Ambulatory Visit: Payer: Medicare Other

## 2013-10-25 NOTE — Telephone Encounter (Signed)
Order placed and will let [facility] know

## 2013-10-25 NOTE — Telephone Encounter (Signed)
Libby, please advise what has been done with this pt. Thanks.

## 2013-10-25 NOTE — Telephone Encounter (Signed)
i have been unable to reach this pt by phone i think we shoulp put in a order for the mask she needs  For [facility] [name] [name]

## 2013-10-28 ENCOUNTER — Encounter: Payer: Self-pay | Admitting: Internal Medicine

## 2013-11-01 ENCOUNTER — Other Ambulatory Visit: Payer: Self-pay | Admitting: Oncology

## 2013-11-01 ENCOUNTER — Other Ambulatory Visit: Payer: Self-pay | Admitting: Endocrinology

## 2013-11-01 DIAGNOSIS — C50919 Malignant neoplasm of unspecified site of unspecified female breast: Secondary | ICD-10-CM

## 2013-11-02 NOTE — Telephone Encounter (Signed)
Please advise on status libby. Thanks.

## 2013-11-03 ENCOUNTER — Ambulatory Visit (AMBULATORY_SURGERY_CENTER): Payer: Medicare Other | Admitting: Internal Medicine

## 2013-11-03 ENCOUNTER — Other Ambulatory Visit: Payer: Self-pay | Admitting: Internal Medicine

## 2013-11-03 ENCOUNTER — Encounter: Payer: Self-pay | Admitting: Internal Medicine

## 2013-11-03 VITALS — BP 140/73 | HR 58 | Temp 98.6°F | Resp 17 | Ht 62.0 in | Wt 175.0 lb

## 2013-11-03 DIAGNOSIS — K2270 Barrett's esophagus without dysplasia: Secondary | ICD-10-CM

## 2013-11-03 MED ORDER — NEXIUM 40 MG PO CPDR: DELAYED_RELEASE_CAPSULE | ORAL | Status: DC

## 2013-11-03 MED ORDER — SODIUM CHLORIDE 0.9 % IV SOLN: 500.0000 mL | INTRAVENOUS | Status: DC

## 2013-11-03 NOTE — Progress Notes (Signed)
Report to PACU, RN, vss, BBS= Clear.  

## 2013-11-03 NOTE — Op Note (Signed)
[place]  [address] [place], [id number]   ENDOSCOPY PROCEDURE REPORT  PATIENT: [name] [name], [name] [name]  MR#: [id number] BIRTHDATE: [birth year] , 68  yrs. old GENDER: female ENDOSCOPIST: Lafayette Dragon, MD REFERRED BY:  Donavan Foil, M.D. PROCEDURE DATE:  11/03/2013 PROCEDURE:  EGD w/ biopsy ASA CLASS:     Class II INDICATIONS:  Barrett's esophagus found on endoscopy in  08/2011,. MEDICATIONS: Monitored anesthesia care and Propofol 200 mg IV TOPICAL ANESTHETIC: none  DESCRIPTION OF PROCEDURE: After the risks benefits and alternatives of the procedure were thoroughly explained, informed consent was obtained.  The LB [id number] endoscope was introduced through the mouth and advanced to the second portion of the duodenum , Without limitations.  The instrument was slowly withdrawn as the mucosa was fully examined.    Esophagus: proximal, mid and distal esophageal mucosa was normal. Squamocolumnar junction was irregular. Biopsies were obtained from 2 .there was no stricture or esophagitis. There was no significant hiatal hernia Stomach: gastric folds were normal, gastric antrum and pyloric outlet was unremarkable. Retroflexion of the endoscope revealed normal fundus and cardia Duodenum: the duodenal bulb and descending duodenum was normalcolumns of the gastric mucosa[         The scope was then withdrawn from the patient and the procedure completed.  COMPLICATIONS: There were no immediate complications.  ENDOSCOPIC IMPRESSION:  1.followup Barrett's esophagus. Status post biopsies from irregular appearing squamocolumnar junction 2. normal stomach and duodenum  RECOMMENDATIONS: 1.  Await pathology results 2.  Anti-reflux regimen to be follow 3.  Continue current medications -Nexiem 40 mg  REPEAT EXAM: for EGD pending biopsy results.  eSigned:  Lafayette Dragon, MD 11/03/2013 9:23 AM    CC:  PATIENT NAME:  Zoraida, Havrilla MR#: [id number]

## 2013-11-03 NOTE — Progress Notes (Signed)
Called to room to assist during endoscopic procedure.  Patient ID and intended procedure confirmed with present staff. Received instructions for my participation in the procedure from the performing physician.  

## 2013-11-03 NOTE — Patient Instructions (Signed)

## 2013-11-04 ENCOUNTER — Telehealth: Payer: Self-pay

## 2013-11-04 NOTE — Telephone Encounter (Signed)
Order was given to [facility] on 10/25/13 [name] [name]

## 2013-11-04 NOTE — Telephone Encounter (Signed)
Left a message at [phone] for the pt to call back if any questions or concerns. maw

## 2013-11-08 ENCOUNTER — Encounter: Payer: Self-pay | Admitting: Internal Medicine

## 2013-11-19 ENCOUNTER — Telehealth: Payer: Self-pay | Admitting: Internal Medicine

## 2013-11-19 NOTE — Telephone Encounter (Signed)
Patient states that as of 01/2014, insurance will no longer cover Nexium. They will cover esomeprazole. I have advised that this is the generic version of Nexium. She is okay with this and knows to call back if any problems.

## 2013-12-03 ENCOUNTER — Other Ambulatory Visit: Payer: Self-pay | Admitting: Endocrinology

## 2013-12-29 ENCOUNTER — Other Ambulatory Visit: Payer: Self-pay | Admitting: Endocrinology

## 2014-02-02 ENCOUNTER — Other Ambulatory Visit: Payer: Self-pay | Admitting: Endocrinology

## 2014-02-09 DIAGNOSIS — G4733 Obstructive sleep apnea (adult) (pediatric): Secondary | ICD-10-CM | POA: Diagnosis not present

## 2014-02-22 ENCOUNTER — Other Ambulatory Visit: Payer: Self-pay | Admitting: Nurse Practitioner

## 2014-03-03 ENCOUNTER — Other Ambulatory Visit: Payer: Self-pay | Admitting: Endocrinology

## 2014-03-08 ENCOUNTER — Other Ambulatory Visit: Payer: Self-pay | Admitting: Endocrinology

## 2014-03-08 ENCOUNTER — Other Ambulatory Visit: Payer: Self-pay | Admitting: Oncology

## 2014-03-09 ENCOUNTER — Other Ambulatory Visit: Payer: Self-pay | Admitting: Oncology

## 2014-03-09 NOTE — Telephone Encounter (Signed)
Duplicate - phoned in 08/10/56

## 2014-03-10 ENCOUNTER — Telehealth: Payer: Self-pay | Admitting: *Deleted

## 2014-03-10 ENCOUNTER — Telehealth: Payer: Self-pay | Admitting: Internal Medicine

## 2014-03-10 NOTE — Telephone Encounter (Signed)
She will have to rell [place] what they cover. Prilosec  20 mg  Is most likely covered, #30, 1 po qd. 6 refills

## 2014-03-10 NOTE — Telephone Encounter (Signed)
Patient called today to say they can no longer take Nexium because it is not covered by their insurance ([employer]-Medicare Complete through [employer]). What else can the patient take? Please advise.

## 2014-03-11 MED ORDER — OMEPRAZOLE 20 MG PO CPDR: 20.0000 mg | DELAYED_RELEASE_CAPSULE | Freq: Every day | ORAL | Status: DC

## 2014-03-11 NOTE — Telephone Encounter (Signed)
Sent Rx to [facility] on [place], [place] for Prilosec, 20 mg, #30, six refills; Take one tablet, by mouth, every day. Called and talked to the patient's husband to let them know I was sending their Rx. Patient stated they understood.

## 2014-03-14 DIAGNOSIS — H26491 Other secondary cataract, right eye: Secondary | ICD-10-CM | POA: Diagnosis not present

## 2014-03-18 ENCOUNTER — Other Ambulatory Visit: Payer: Self-pay | Admitting: Nurse Practitioner

## 2014-03-22 DIAGNOSIS — H26491 Other secondary cataract, right eye: Secondary | ICD-10-CM | POA: Diagnosis not present

## 2014-03-24 NOTE — Telephone Encounter (Signed)
Prescription sent to Rx °

## 2014-03-25 ENCOUNTER — Telehealth: Payer: Self-pay | Admitting: Oncology

## 2014-03-25 NOTE — Telephone Encounter (Signed)
per GM to move GM on call-cld & left pt a message of updated time & date

## 2014-04-04 ENCOUNTER — Telehealth: Payer: Self-pay | Admitting: Internal Medicine

## 2014-04-04 ENCOUNTER — Telehealth: Payer: Self-pay | Admitting: Endocrinology

## 2014-04-04 NOTE — Telephone Encounter (Signed)
Very sorry, but I cannot accept her as PCP, Dr Loanne Drilling must be asked, and give his OK, as this is against office policy

## 2014-04-04 NOTE — Telephone Encounter (Signed)
Patient is insistent on seeing you regarding a medication follow up. She has not seen you since 2014, and dr Loanne Drilling is her pcp. She states that her husband is your patient and she wants to see you. Please let me know if this is ok and i will call her to advise.

## 2014-04-04 NOTE — Telephone Encounter (Signed)
Patient would like to know if her pneumonia and tetanus shots are due. Please advise

## 2014-04-04 NOTE — Telephone Encounter (Addendum)
Contacted pt and advised her the Prev13 is due. Pt is coming for ov with Dr. Loanne Drilling on 04/07/2013. Immunization will be given at ov.

## 2014-04-05 ENCOUNTER — Other Ambulatory Visit: Payer: Self-pay | Admitting: Oncology

## 2014-04-05 ENCOUNTER — Other Ambulatory Visit: Payer: Self-pay | Admitting: *Deleted

## 2014-04-05 ENCOUNTER — Other Ambulatory Visit: Payer: Self-pay | Admitting: Endocrinology

## 2014-04-06 NOTE — Telephone Encounter (Signed)
Ok with me 

## 2014-04-06 NOTE — Telephone Encounter (Signed)
Patient is requesting to transfer from [facility] to [facility].  Please advise.

## 2014-04-06 NOTE — Telephone Encounter (Signed)
See note below   Thanks

## 2014-04-06 NOTE — Telephone Encounter (Signed)
ok 

## 2014-04-06 NOTE — Telephone Encounter (Signed)
Left a message with husband for patient to call back to schedule appointment with Dr. Jenny Reichmann.

## 2014-04-07 ENCOUNTER — Other Ambulatory Visit: Payer: Self-pay | Admitting: *Deleted

## 2014-04-07 DIAGNOSIS — C50919 Malignant neoplasm of unspecified site of unspecified female breast: Secondary | ICD-10-CM

## 2014-04-08 ENCOUNTER — Other Ambulatory Visit (HOSPITAL_BASED_OUTPATIENT_CLINIC_OR_DEPARTMENT_OTHER): Payer: Medicare Other

## 2014-04-08 ENCOUNTER — Ambulatory Visit: Payer: Self-pay | Admitting: Endocrinology

## 2014-04-08 DIAGNOSIS — C50919 Malignant neoplasm of unspecified site of unspecified female breast: Secondary | ICD-10-CM

## 2014-04-08 DIAGNOSIS — C50419 Malignant neoplasm of upper-outer quadrant of unspecified female breast: Secondary | ICD-10-CM

## 2014-04-08 LAB — COMPREHENSIVE METABOLIC PANEL (CC13)
ALBUMIN: 3.6 g/dL (ref 3.5–5.0)
ALT: 44 U/L (ref 0–55)
ANION GAP: 10 meq/L (ref 3–11)
AST: 46 U/L — ABNORMAL HIGH (ref 5–34)
Alkaline Phosphatase: 65 U/L (ref 40–150)
BILIRUBIN TOTAL: 0.4 mg/dL (ref 0.20–1.20)
BUN: 13 mg/dL (ref 7.0–26.0)
CALCIUM: 9 mg/dL (ref 8.4–10.4)
CHLORIDE: 106 meq/L (ref 98–109)
CO2: 28 mEq/L (ref 22–29)
Creatinine: 0.8 mg/dL (ref 0.6–1.1)
EGFR: 80 mL/min/{1.73_m2} — AB (ref >90)
Glucose: 153 mg/dl — ABNORMAL HIGH (ref 70–140)
POTASSIUM: 3.8 meq/L (ref 3.5–5.1)
Sodium: 144 mEq/L (ref 136–145)
Total Protein: 6.9 g/dL (ref 6.4–8.3)

## 2014-04-08 LAB — CBC WITH DIFFERENTIAL/PLATELET
BASO%: 0.7 % (ref 0.0–2.0)
Basophils Absolute: 0.1 10*3/uL (ref 0.0–0.1)
EOS%: 1.6 % (ref 0.0–7.0)
Eosinophils Absolute: 0.1 10*3/uL (ref 0.0–0.5)
HCT: 37.6 % (ref 34.8–46.6)
HGB: 12.2 g/dL (ref 11.6–15.9)
LYMPH%: 33.9 % (ref 14.0–49.7)
MCH: 32.1 pg (ref 25.1–34.0)
MCHC: 32.5 g/dL (ref 31.5–36.0)
MCV: 98.8 fL (ref 79.5–101.0)
MONO#: 0.5 10*3/uL (ref 0.1–0.9)
MONO%: 6 % (ref 0.0–14.0)
NEUT%: 57.8 % (ref 38.4–76.8)
NEUTROS ABS: 4.6 10*3/uL (ref 1.5–6.5)
Platelets: 220 10*3/uL (ref 145–400)
RBC: 3.81 10*6/uL (ref 3.70–5.45)
RDW: 13.2 % (ref 11.2–14.5)
WBC: 7.9 10*3/uL (ref 3.9–10.3)
lymph#: 2.7 10*3/uL (ref 0.9–3.3)

## 2014-04-11 ENCOUNTER — Ambulatory Visit: Payer: Self-pay | Admitting: Endocrinology

## 2014-04-11 ENCOUNTER — Telehealth: Payer: Self-pay | Admitting: *Deleted

## 2014-04-11 NOTE — Telephone Encounter (Signed)
Call received asking if [facility] is open on Friday as her other offices are closed due to [place] and thought she was to see Dr. Jana Hakim Friday.  Confirmed appointment date and time for Friday at 1:45 with Noel Christmas NP.   I want to see Dr. Jana Hakim.  I have questions and things to go over because I am moving.  We have sold our house since my husband retired and are going out of the country.  We leave April 21, 2014 and I can be seen any day or time by Dr. Jana Hakim.  Will notify Dr. Jana Hakim of this request.

## 2014-04-11 NOTE — Telephone Encounter (Signed)
Verbal order received and read back from Dr. Jana Hakim that he will see patient during her visit with [facility].  Asked if she wants her medical records and she does want records.  Does not know who she will see when she moves.  Informed her no charge for records.

## 2014-04-13 ENCOUNTER — Ambulatory Visit (INDEPENDENT_AMBULATORY_CARE_PROVIDER_SITE_OTHER): Payer: Medicare Other | Admitting: Endocrinology

## 2014-04-13 ENCOUNTER — Ambulatory Visit: Payer: Self-pay | Admitting: Endocrinology

## 2014-04-13 ENCOUNTER — Encounter: Payer: Self-pay | Admitting: Endocrinology

## 2014-04-13 VITALS — BP 122/70 | HR 90 | Temp 98.8°F | Ht 62.0 in | Wt 172.0 lb

## 2014-04-13 DIAGNOSIS — B3749 Other urogenital candidiasis: Secondary | ICD-10-CM | POA: Diagnosis not present

## 2014-04-13 DIAGNOSIS — Z23 Encounter for immunization: Secondary | ICD-10-CM | POA: Diagnosis not present

## 2014-04-13 DIAGNOSIS — T8351XA Infection and inflammatory reaction due to indwelling urinary catheter, initial encounter: Secondary | ICD-10-CM

## 2014-04-13 DIAGNOSIS — E739 Lactose intolerance, unspecified: Secondary | ICD-10-CM

## 2014-04-13 DIAGNOSIS — R0600 Dyspnea, unspecified: Secondary | ICD-10-CM

## 2014-04-13 DIAGNOSIS — N390 Urinary tract infection, site not specified: Secondary | ICD-10-CM

## 2014-04-13 DIAGNOSIS — E785 Hyperlipidemia, unspecified: Secondary | ICD-10-CM | POA: Diagnosis not present

## 2014-04-13 DIAGNOSIS — D509 Iron deficiency anemia, unspecified: Secondary | ICD-10-CM

## 2014-04-13 DIAGNOSIS — M2550 Pain in unspecified joint: Secondary | ICD-10-CM | POA: Diagnosis not present

## 2014-04-13 DIAGNOSIS — M810 Age-related osteoporosis without current pathological fracture: Secondary | ICD-10-CM

## 2014-04-13 DIAGNOSIS — T83511A Infection and inflammatory reaction due to indwelling urethral catheter, initial encounter: Secondary | ICD-10-CM

## 2014-04-13 DIAGNOSIS — E538 Deficiency of other specified B group vitamins: Secondary | ICD-10-CM

## 2014-04-13 MED ORDER — ALPRAZOLAM 0.25 MG PO TABS: 0.2500 mg | ORAL_TABLET | Freq: Three times a day (TID) | ORAL | Status: AC | PRN

## 2014-04-13 MED ORDER — GABAPENTIN 100 MG PO CAPS: 200.0000 mg | ORAL_CAPSULE | Freq: Two times a day (BID) | ORAL | Status: DC

## 2014-04-13 MED ORDER — LEVOTHYROXINE SODIUM 25 MCG PO TABS: 25.0000 ug | ORAL_TABLET | Freq: Every day | ORAL | Status: AC

## 2014-04-13 MED ORDER — ALBUTEROL SULFATE HFA 108 (90 BASE) MCG/ACT IN AERS: 2.0000 | INHALATION_SPRAY | Freq: Four times a day (QID) | RESPIRATORY_TRACT | Status: AC | PRN

## 2014-04-13 MED ORDER — FUROSEMIDE 20 MG PO TABS: 20.0000 mg | ORAL_TABLET | ORAL | Status: AC | PRN

## 2014-04-13 MED ORDER — MONTELUKAST SODIUM 10 MG PO TABS: 10.0000 mg | ORAL_TABLET | Freq: Every day | ORAL | Status: AC

## 2014-04-13 MED ORDER — OMEPRAZOLE 40 MG PO CPDR: 40.0000 mg | DELAYED_RELEASE_CAPSULE | Freq: Every day | ORAL | Status: AC

## 2014-04-13 NOTE — Patient Instructions (Addendum)
Here is a prescription for your symptoms.  blood tests are being requested for you today.  We'll let you know about the results. Best wishes for your new life in [place].

## 2014-04-13 NOTE — Progress Notes (Signed)
Subjective:    Patient ID: [name] [name], female    DOB: 24-Oct-1945, 69 y.o.   MRN: [id number]  HPI  Pt states 1 year of intermittent moderate cramps in the legs, in the context of sleep  She has not recently taken B-12 injections. Past Medical History  Diagnosis Date  . Chronic cystitis   . Hx of adenomatous colonic polyps   . Hyperlipidemia   . Multinodular goiter   . Osteoporosis   . History of Helicobacter pylori infection   . Hypothyroidism   . Anxiety   . GERD (gastroesophageal reflux disease)   . H/O hiatal hernia   . 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 History of chemotherapy     taxoterte/cytoxan 4 cycles completed 09/08/09  . History of hypertension     no meds post chemo 2011 due to hypotention  . History of radiation therapy     right breast cancer  2011  . Iron deficiency anemia     HX IRON INFUSION'S  LAST ONE AUG 2014  . Pelvic prolapse   . Urgency of urination   . Incomplete emptying of bladder   . OSA on CPAP     SLEEP STUDY 07-16-2007  MODERATE OSA  . Sleep apnea     cpap  . Allergy     Past Surgical History  Procedure Laterality Date  . Carpal tunnel release Bilateral 2000 (approx)  . Urethral dilation  1970'S  . Cataract extraction w/ intraocular lens  implant, bilateral    . Right breast lumpectomy w/  node dissection  05-29-2009  . Breast lumpectomy Left 04/2009    BENIGN  . Bilateral benign breast bx'  YRS AGO  . Abdominal hysterectomy  1980    W/ RIGHT SALPINGOOPHORECTOMY AND APPENDECTOMY  . Lumbar spine surgery  01-23-2013  . Transthoracic echocardiogram  10-16-2009    MILD LVH/  EF 12-75%/  GRADE I DIASTOLIC DYSFUNCTION  . Pubovaginal sling N/A 02/22/2013    Procedure: BOSTON SCIENTIFIC UPHOLD LITE POSTERIOR VAULT PROLAPSE  REPAIR ;  Surgeon: Ailene Rud, MD;  Location: [facility];  Service: Urology;  Laterality: N/A;  . Anterior and posterior repair N/A 02/22/2013    Procedure:  KELLY PLICATION POSTERIOR WITH COLOPLAST TISSUE Velvet Bathe ;  Surgeon: Ailene Rud, MD;  Location: [facility];  Service: Urology;  Laterality: N/A;    History   Social History  . Marital Status: Married    Spouse Name: N/A  . Number of Children: 3  . Years of Education: N/A   Occupational History  . Retired    Social History Main Topics  . Smoking status: Never Smoker   . Smokeless tobacco: Never Used  . Alcohol Use: No  . Drug Use: No  . Sexual Activity: Not on file   Other Topics Concern  . Not on file   Social History Narrative   Retired Chartered certified accountant    Current Outpatient Prescriptions on File Prior to Visit  Medication Sig Dispense Refill  . Calcium Carbonate-Vitamin D (CALTRATE 600+D) 600-400 MG-UNIT per tablet Take 1 tablet by mouth daily.     . EUFLEXXA 20 MG/2ML SOSY Inject as directed. Every 2 years.    Marland Kitchen  ibuprofen (ADVIL,MOTRIN) 200 MG tablet Take 200 mg by mouth every 6 (six) hours as needed.    Marland Kitchen LORazepam (ATIVAN) 0.5 MG tablet TAKE ONE TABLET BY MOUTH EVERY 4 HOURS AS NEEDED 60 tablet 0  . tamoxifen (NOLVADEX) 20 MG tablet TAKE ONE TABLET BY MOUTH ONCE DAILY. 90 tablet 1   No current facility-administered medications on file prior to visit.    Allergies  Allergen Reactions  . Penicillins Rash    Family History  Problem Relation Age of Onset  . Asthma Mother   . Colon cancer Father 60  . Hypertension Sister   . Hypertension Brother   . Ulcerative colitis Son   . Asthma Son   . Stomach cancer Maternal Grandfather   . Thyroid disease Other     Aunt  . Arthritis Other   . Prostate cancer Other     BP 122/70 mmHg  Pulse 90  Temp(Src) 98.8 F (37.1 C) (Oral)  Ht $R'5\' 2"'hZ$  (1.575 m)  Wt 172 lb (78.019 kg)  BMI 31.45 kg/m2  SpO2  96%    Review of Systems Denies weight change.  She has severe anxiety with flying.     Objective:   Physical Exam VITAL SIGNS:  See vs page. GENERAL: no distress. LUNGS:  Clear to auscultation HEART:  Regular rate and rhythm without murmurs noted. Normal S1,S2.   PSYCH: Alert and well-oriented.  Does not appear anxious nor depressed.      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  i'll do the best i can.  i advised pt to take injections monthly as rx'ed.  Anxiety: due to air travel   Patient is advised the following: Patient Instructions  Here is a prescription for your symptoms.  blood tests are being requested for you today.  We'll let you know about the results. Best wishes for your new life in [place].

## 2014-04-14 ENCOUNTER — Telehealth: Payer: Self-pay | Admitting: *Deleted

## 2014-04-14 LAB — LIPID PANEL
Cholesterol: 148 mg/dL (ref 0–200)
HDL: 55.8 mg/dL (ref >39.00)
LDL Cholesterol: 64 mg/dL (ref 0–99)
NONHDL: 92.2
Total CHOL/HDL Ratio: 3
Triglycerides: 143 mg/dL (ref 0.0–149.0)
VLDL: 28.6 mg/dL (ref 0.0–40.0)

## 2014-04-14 LAB — HEMOGLOBIN A1C: HEMOGLOBIN A1C: 5.9 % (ref 4.6–6.5)

## 2014-04-14 LAB — TSH: TSH: 3.26 u[IU]/mL (ref 0.35–4.50)

## 2014-04-14 LAB — VITAMIN B12: Vitamin B-12: 381 pg/mL (ref 211–911)

## 2014-04-14 LAB — VITAMIN D 25 HYDROXY (VIT D DEFICIENCY, FRACTURES): VITD: 26.38 ng/mL — ABNORMAL LOW (ref 30.00–100.00)

## 2014-04-14 NOTE — Telephone Encounter (Signed)
Called asking to change today's appointment due to receipt of pneumonia vaccine at PCP that has left her feeling fererish.  Appointment is tomorrow.  She will be here tomorrow as scheduled.  Also instructed to pick up her records at front registration desk tomorrow.

## 2014-04-15 ENCOUNTER — Other Ambulatory Visit: Payer: Self-pay | Admitting: *Deleted

## 2014-04-15 ENCOUNTER — Encounter: Payer: Self-pay | Admitting: Nurse Practitioner

## 2014-04-15 ENCOUNTER — Ambulatory Visit: Payer: Self-pay | Admitting: Endocrinology

## 2014-04-15 ENCOUNTER — Telehealth: Payer: Self-pay | Admitting: Nurse Practitioner

## 2014-04-15 ENCOUNTER — Ambulatory Visit (HOSPITAL_BASED_OUTPATIENT_CLINIC_OR_DEPARTMENT_OTHER): Payer: Medicare Other | Admitting: Nurse Practitioner

## 2014-04-15 VITALS — BP 144/60 | HR 77 | Resp 20 | Wt 172.3 lb

## 2014-04-15 DIAGNOSIS — Z170 Estrogen receptor positive status [ER+]: Secondary | ICD-10-CM | POA: Diagnosis not present

## 2014-04-15 DIAGNOSIS — C50411 Malignant neoplasm of upper-outer quadrant of right female breast: Secondary | ICD-10-CM

## 2014-04-15 DIAGNOSIS — C50911 Malignant neoplasm of unspecified site of right female breast: Secondary | ICD-10-CM

## 2014-04-15 DIAGNOSIS — M8580 Other specified disorders of bone density and structure, unspecified site: Secondary | ICD-10-CM | POA: Diagnosis not present

## 2014-04-15 DIAGNOSIS — C50919 Malignant neoplasm of unspecified site of unspecified female breast: Secondary | ICD-10-CM

## 2014-04-15 LAB — PTH, INTACT AND CALCIUM
CALCIUM: 9 mg/dL (ref 8.4–10.5)
PTH: 45 pg/mL (ref 14–64)

## 2014-04-15 MED ORDER — TAMOXIFEN CITRATE 20 MG PO TABS: 20.0000 mg | ORAL_TABLET | Freq: Every day | ORAL | Status: AC

## 2014-04-15 NOTE — Progress Notes (Signed)
ID: [name] [name]   DOB: 05-08-1945  MR#: [id number]  CSN#:[id number]  PCP: Romero Belling, MD GYN: Genia Del SU: Marissa Howard-McNatt OTHER MD: Alford Highland, [name], Lenise Arena, Sigmund Heywood Bene  CHIEF COMPLAINT: right breast cancer CURRENT TREATMENT: tamoxifen daily  BREAST CANCER HISTORY: From Dr. Theron Arista Rubin's note 06/07/2009:  "She undergoes annual screening mammography.  She has had previous biopsies of both breasts showing benign disease.  She had a screening mammogram on 04/13/2009, which showed a possible mass in the right breast, further imaging is recommended.  She subsequently had a digital diagnostic mammogram and right breast ultrasound on 04/21/2009.  This showed a hyperechoic mass at 7 o'clock position measuring  5 x 5 x 7 mm, this was suspicious.  A clip was placed and a biopsy was performed on 05/11/2009.  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  This was ER positive 100% and PR positive 17%, proliferative index 68%, the HER-2 was not amplified with a ratio of 0.94.  The patient subsequently had an MRI scan of both breasts on 05/15/2009 showed a solitary mass in the upper outer quadrant of the right breast measuring 1.2 x 1.1 x 0.8 cm.  She subsequently was seen at [facility], underwent a lumpectomy and sentinel lymph node evaluation on 05/29/2009 by Dr. Ashok Cordia Howard-McNatt.  Final pathology showed a high-grade invasive ductal cancer measuring 1.2 cm.  Five sentinel lymph nodes were identified, all of which were negative for metastatic disease.  The invasive carcinoma was less than 1 mm from lateral margin.  A minute focus of DCIS was subsequently seen in the lateral margin, seen with re-excision measured 1.5 mm from the margin.  The other margins appeared to be technically clear.  This had overall histological grade 3. "   Her subsequent history is as detailed below  INTERVAL HISTORY: The patient  returns today for follow up of her breast cancer. She has been on tamoxifen since July 2012 and is tolerating this drug well. She denies hot flashes and vaginal changes. She is moving to [place] this Spring to be closer to her children, and will be switching to a new oncology provider soon. This will be her last visit with [place].   REVIEW OF SYSTEMS: Tawan denies fevers, chills, nausea, or vomiting. She has some constipations, but takes fiber supplements daily. She is eating and drinking well. She sleeps well at night, and has good energy during the day. She denies shortness of breath, chest pain, cough, or palpitations. She has chronically dry and itchy skin, but does not like to apply lotion often. She has burning pain to her bilateral feet from neuropathy and takes 300mg  gabapentin BID. She takes tramadol for her bilateral foot pain, and she states she experiences some swelling by the end of the day. A detailed review of systems is otherwise stable.  PAST MEDICAL HISTORY: Past Medical History  Diagnosis Date  . Chronic cystitis   . Hx of adenomatous colonic polyps   . Hyperlipidemia   . Multinodular goiter   . Osteoporosis   . History of Helicobacter pylori infection   . Hypothyroidism   . Anxiety   . GERD (gastroesophageal reflux disease)   . H/O hiatal hernia   . 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 History of chemotherapy     taxoterte/cytoxan 4 cycles completed 09/08/09  . History of hypertension     no meds post chemo 2011 due to hypotention  . History of radiation therapy     right breast cancer  2011  . Iron deficiency anemia     HX IRON INFUSION'S  LAST ONE AUG 2014  . Pelvic prolapse   . Urgency of urination   . Incomplete emptying of bladder   . OSA on CPAP      SLEEP STUDY 07-16-2007  MODERATE OSA  . Sleep apnea     cpap  . Allergy     PAST SURGICAL HISTORY: Past Surgical History  Procedure Laterality Date  . Carpal tunnel release Bilateral 2000 (approx)  . Urethral dilation  1970'S  . Cataract extraction w/ intraocular lens  implant, bilateral    . Right breast lumpectomy w/  node dissection  05-29-2009  . Breast lumpectomy Left 04/2009    BENIGN  . Bilateral benign breast bx'  YRS AGO  . Abdominal hysterectomy  1980    W/ RIGHT SALPINGOOPHORECTOMY AND APPENDECTOMY  . Lumbar spine surgery  01-23-2013  . Transthoracic echocardiogram  10-16-2009    MILD LVH/  EF 96-75%/  GRADE I DIASTOLIC DYSFUNCTION  . Pubovaginal sling N/A 02/22/2013    Procedure: BOSTON SCIENTIFIC UPHOLD LITE POSTERIOR VAULT PROLAPSE REPAIR ;  Surgeon: Ailene Rud, MD;  Location: [facility];  Service: Urology;  Laterality: N/A;  . Anterior and posterior repair N/A 02/22/2013    Procedure:  KELLY PLICATION POSTERIOR WITH COLOPLAST TISSUE Velvet Bathe ;  Surgeon: Ailene Rud, MD;  Location: [facility];  Service: Urology;  Laterality: N/A;    FAMILY HISTORY Family History  Problem Relation Age of Onset  . Asthma Mother   . Colon cancer Father 43  . Hypertension Sister   . Hypertension Brother   . Ulcerative colitis Son   . Asthma Son   . Stomach cancer Maternal Grandfather   . Thyroid disease Other     Aunt  . Arthritis Other   . Prostate cancer Other    the patient's father died at age 56 but she is not sure of the cause. The patient's mother is currently living, age 20. The patient had one brother and 4 sisters. There is no history of breast or ovarian cancer in the family to her knowledge.   GYNECOLOGIC HISTORY:  menarche age 1, first live birth age 72, she is [place] P3. She had a hysterectomy approximately 32 years ago, and took hormone replacement for approximately 12 years after that.   SOCIAL HISTORY:  she  used to work as a Public relations account executive, but is now retired. Her husband Ardis Rowan is a retired professor in the social studies Department at [employer]. Daughter Imagene Sheller is a Psychologist, prison and probation services in pediatric infectious diseases in [place]. Son Pascha Fogal is an Forensic psychologist in [place]. Son Tatia Petrucci works in Charity fundraiser in [place]. The patient has 4 biological and one adopted grandchildren. She is not involved in organized religion.    ADVANCED DIRECTIVES: not in place   HEALTH MAINTENANCE: History  Substance Use Topics  . Smoking status: Never Smoker   . Smokeless tobacco: Never Used  . Alcohol Use: No     Colonoscopy:  PAP:  Bone density:  Lipid panel:  Allergies  Allergen Reactions  .  Penicillins Rash    Current Outpatient Prescriptions  Medication Sig Dispense Refill  . albuterol (PROAIR HFA) 108 (90 BASE) MCG/ACT inhaler Inhale 2 puffs into the lungs every 6 (six) hours as needed for wheezing or shortness of breath. 18 g 5  . ALPRAZolam (XANAX) 0.25 MG tablet Take 1 tablet (0.25 mg total) by mouth 3 (three) times daily as needed for anxiety. 10 tablet 0  . Calcium Carbonate-Vitamin D (CALTRATE 600+D) 600-400 MG-UNIT per tablet Take 1 tablet by mouth daily.     . EUFLEXXA 20 MG/2ML SOSY Inject as directed. Every year.    . gabapentin (NEURONTIN) 100 MG capsule Take 2 capsules (200 mg total) by mouth 2 (two) times daily. 360 capsule 1  . ibuprofen (ADVIL,MOTRIN) 200 MG tablet Take 200 mg by mouth every 6 (six) hours as needed.    Marland Kitchen levothyroxine (SYNTHROID, LEVOTHROID) 25 MCG tablet Take 1 tablet (25 mcg total) by mouth daily. 90 tablet 1  . montelukast (SINGULAIR) 10 MG tablet Take 1 tablet (10 mg total) by mouth at bedtime. 90 tablet 1  . omeprazole (PRILOSEC) 40 MG capsule Take 1 capsule (40 mg total) by mouth daily. 90 capsule 1  . furosemide (LASIX) 20 MG tablet Take 1 tablet (20 mg total) by mouth as needed for fluid. Rarely for swelling (Patient not taking:  Reported on 04/15/2014) 90 tablet 1  . LORazepam (ATIVAN) 0.5 MG tablet TAKE ONE TABLET BY MOUTH EVERY 4 HOURS AS NEEDED (Patient not taking: Reported on 04/15/2014) 60 tablet 0  . tamoxifen (NOLVADEX) 20 MG tablet Take 1 tablet (20 mg total) by mouth daily. 90 tablet 1   No current facility-administered medications for this visit.    OBJECTIVE: middle-aged [place] woman in no acute distress Filed Vitals:   04/15/14 1412  BP: 144/60  Pulse: 77  Resp: 20     Body mass index is 31.51 kg/(m^2).    ECOG FS: 1  Skin: warm, dry  HEENT: sclerae anicteric, conjunctivae pink, oropharynx clear. No thrush or mucositis.  Lymph Nodes: No cervical or supraclavicular lymphadenopathy  Lungs: clear to auscultation bilaterally, no rales, wheezes, or rhonci  Heart: regular rate and rhythm  Abdomen: round, soft, non tender, positive bowel sounds  Musculoskeletal: No focal spinal tenderness, +2 edema to bilateral ankles and feet Neuro: non focal, well oriented, positive affect  Breasts: right breast status post lumpectomy and radiation. No evidence of recurrent disease. Right axilla benign. Left breast unremarkable.    LAB RESULTS: Lab Results  Component Value Date   WBC 7.9 04/08/2014   NEUTROABS 4.6 04/08/2014   HGB 12.2 04/08/2014   HCT 37.6 04/08/2014   MCV 98.8 04/08/2014   PLT 220 04/08/2014      Chemistry      Component Value Date/Time   NA 144 04/08/2014 1407   NA 140 02/23/2013 0541   K 3.8 04/08/2014 1407   K 3.6* 02/23/2013 0541   CL 103 02/23/2013 0541   CL 106 02/28/2012 1328   CO2 28 04/08/2014 1407   CO2 27 02/23/2013 0541   BUN 13.0 04/08/2014 1407   BUN 12 02/23/2013 0541   CREATININE 0.8 04/08/2014 1407   CREATININE 0.82 02/23/2013 0541      Component Value Date/Time   CALCIUM 9.0 04/13/2014 1555   CALCIUM 9.0 04/08/2014 1407   CALCIUM 9.3 12/25/2010 1428   ALKPHOS 65 04/08/2014 1407   ALKPHOS 79 01/18/2011 1339   AST 46* 04/08/2014 1407   AST 37 01/18/2011  1339   ALT 44 04/08/2014 1407   ALT 39* 01/18/2011 1339   BILITOT 0.40 04/08/2014 1407   BILITOT 0.4 01/18/2011 1339       Lab Results  Component Value Date   LABCA2 4 03/28/2011    No components found for: [id number]  No results for input(s): INR in the last 168 hours.  Urinalysis    Component Value Date/Time   COLORURINE YELLOW 09/14/2013 1446   APPEARANCEUR CLEAR 09/14/2013 1446   LABSPEC <=1.005* 09/14/2013 1446   LABSPEC 1.005 08/18/2009 1244   PHURINE 6.0 09/14/2013 1446   GLUCOSEU NEGATIVE 09/14/2013 1446   HGBUR SMALL* 09/14/2013 1446   BILIRUBINUR NEGATIVE 09/14/2013 1446   KETONESUR NEGATIVE 09/14/2013 1446   UROBILINOGEN 0.2 09/14/2013 1446   NITRITE NEGATIVE 09/14/2013 1446   NITRITE Negative 08/18/2009 1244   LEUKOCYTESUR TRACE* 09/14/2013 1446    STUDIES: Bone density 05/06/2012 shows mild osteopenia; mammography 05/10/2013 was unremarkable  ASSESSMENT: 69 y.o. [place] woman originally from [place]  (1) status post right lumpectomy 05/29/2009 for a  pT1c pN0, stage IA invasive ductal carcinoma, grade 3, estrogen receptor 100% and progesterone receptor 17% positive, with an MIB-1 of 68% and no HER-2 amplification.  (2) Oncotype score of 36 fell in the "high-risk" box, and predicted a 24% risk of distant recurrence within 10 years if the patient's only systemic treatment was tamoxifen for 5 years  (3) received cyclophosphamide/ docetaxel x4, completed 09/08/2009  (4) completed adjuvant radiation 12/08/2009  (5) tried letrozole and exemestane with significant side effects, but started tamoxifen in July 2012 and has tolerated it well.  (6) s/p Anterior vault prolapse repair with Claiborne Billings plication and [employer] uphold mesh sacrospinous fixation repair; stage II rectocele repair with Xeroform implant; mid urethral urinary sling using Solyx. Mesh. 02/22/2013   PLAN:  Alaysha is doing well today. The labs were reviewed in detail and were entirely  stable. She is tolerating the tamoxifen well, and will continue this drug for at least 5 years of antiestrogen therapy.   She is due for a repeat mammogram and bone density scan next month, so I have placed orders for this to be performed at the [facility].   Marg plans to find a new provider in the [place] area this Spring, as she prepares for this move, so I have not placed orders for a follow up visit, here. She understands and agrees with this plan. She knows the goal of treatment in her case is cure. She has been encouraged to call with any issues that might arise before her next visit here.  Genelle Gather Boelter    04/15/2014

## 2014-04-15 NOTE — Telephone Encounter (Signed)
avs printed for patient,breast center appointments cannot be made as i will not be a full year until 4/20 and the patient will be moved by that date she will call her insurance company

## 2014-04-25 ENCOUNTER — Other Ambulatory Visit: Payer: Medicare Other

## 2014-04-28 ENCOUNTER — Other Ambulatory Visit: Payer: Self-pay

## 2014-05-02 ENCOUNTER — Ambulatory Visit: Payer: Medicare Other | Admitting: Oncology

## 2014-05-05 ENCOUNTER — Ambulatory Visit: Payer: Self-pay | Admitting: Nurse Practitioner

## 2014-05-08 IMAGING — CR DG THORACIC SPINE 2V
3 series · 3 of 3 positions shown · non-contrast
Comparison: 07/13/2009

CLINICAL DATA: Thoracic back pain.  Breast cancer.

THORACIC SPINE - 2 VIEW

[t t-spine a.p.]
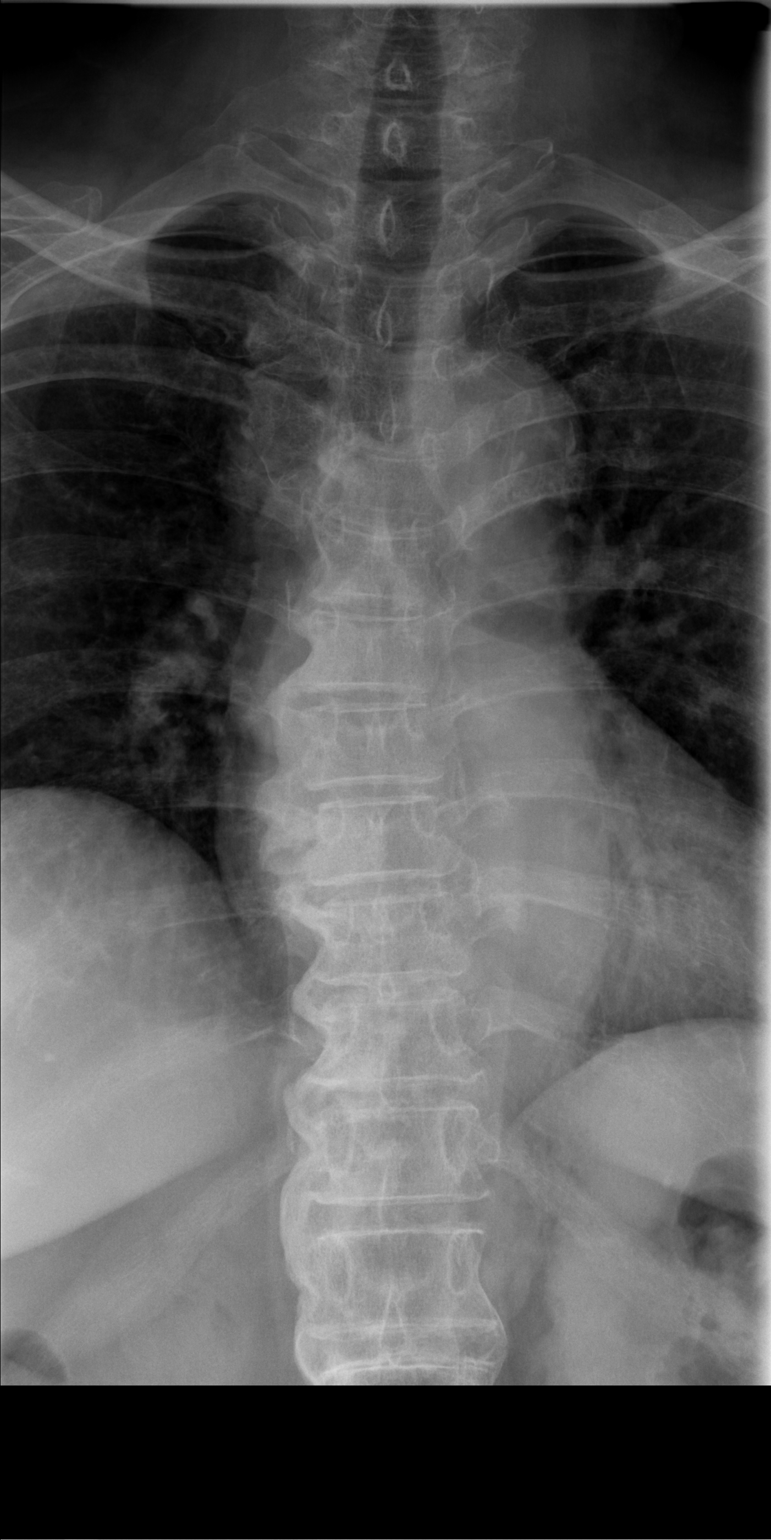

[t t-spine lat]
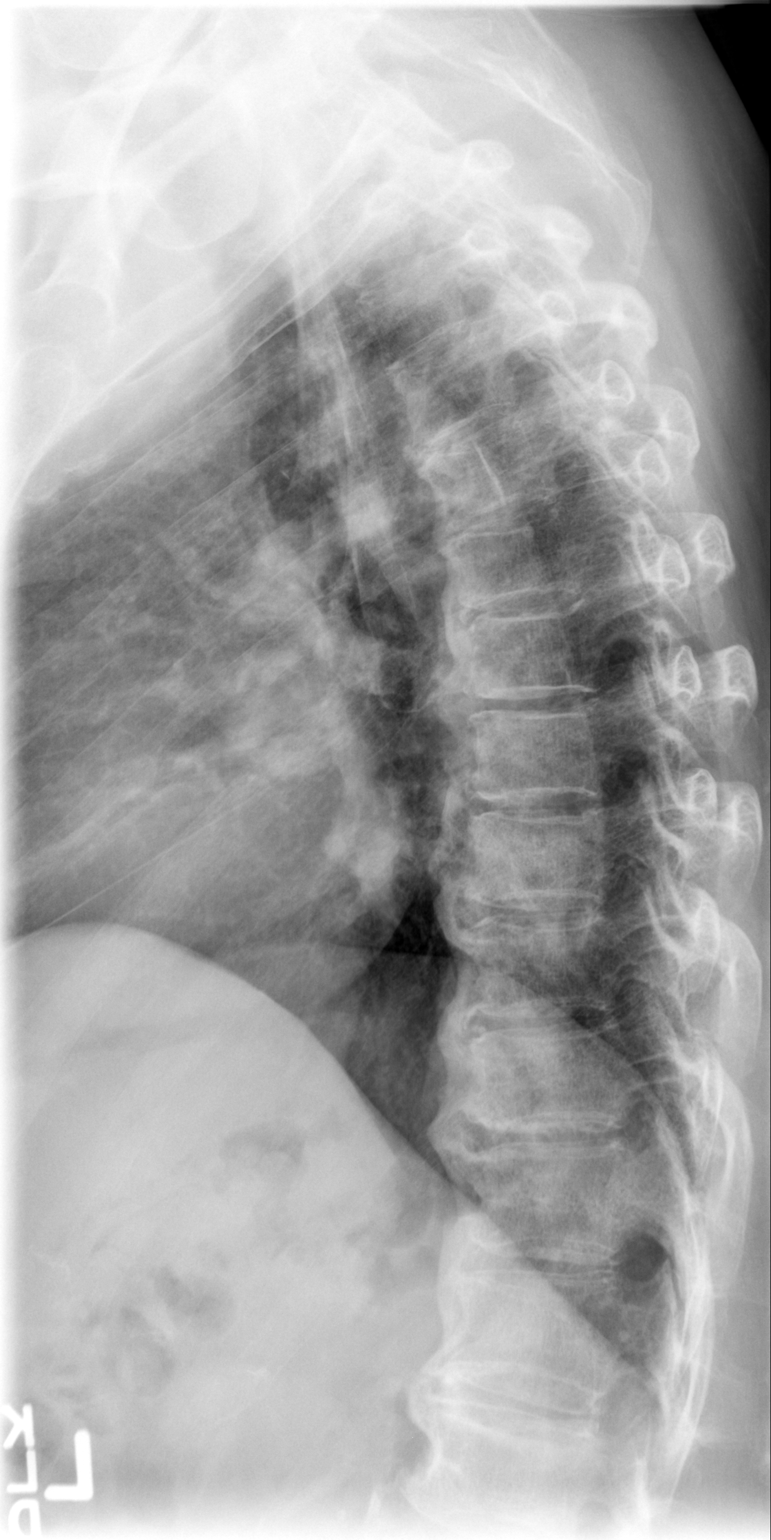

[t swimmers]
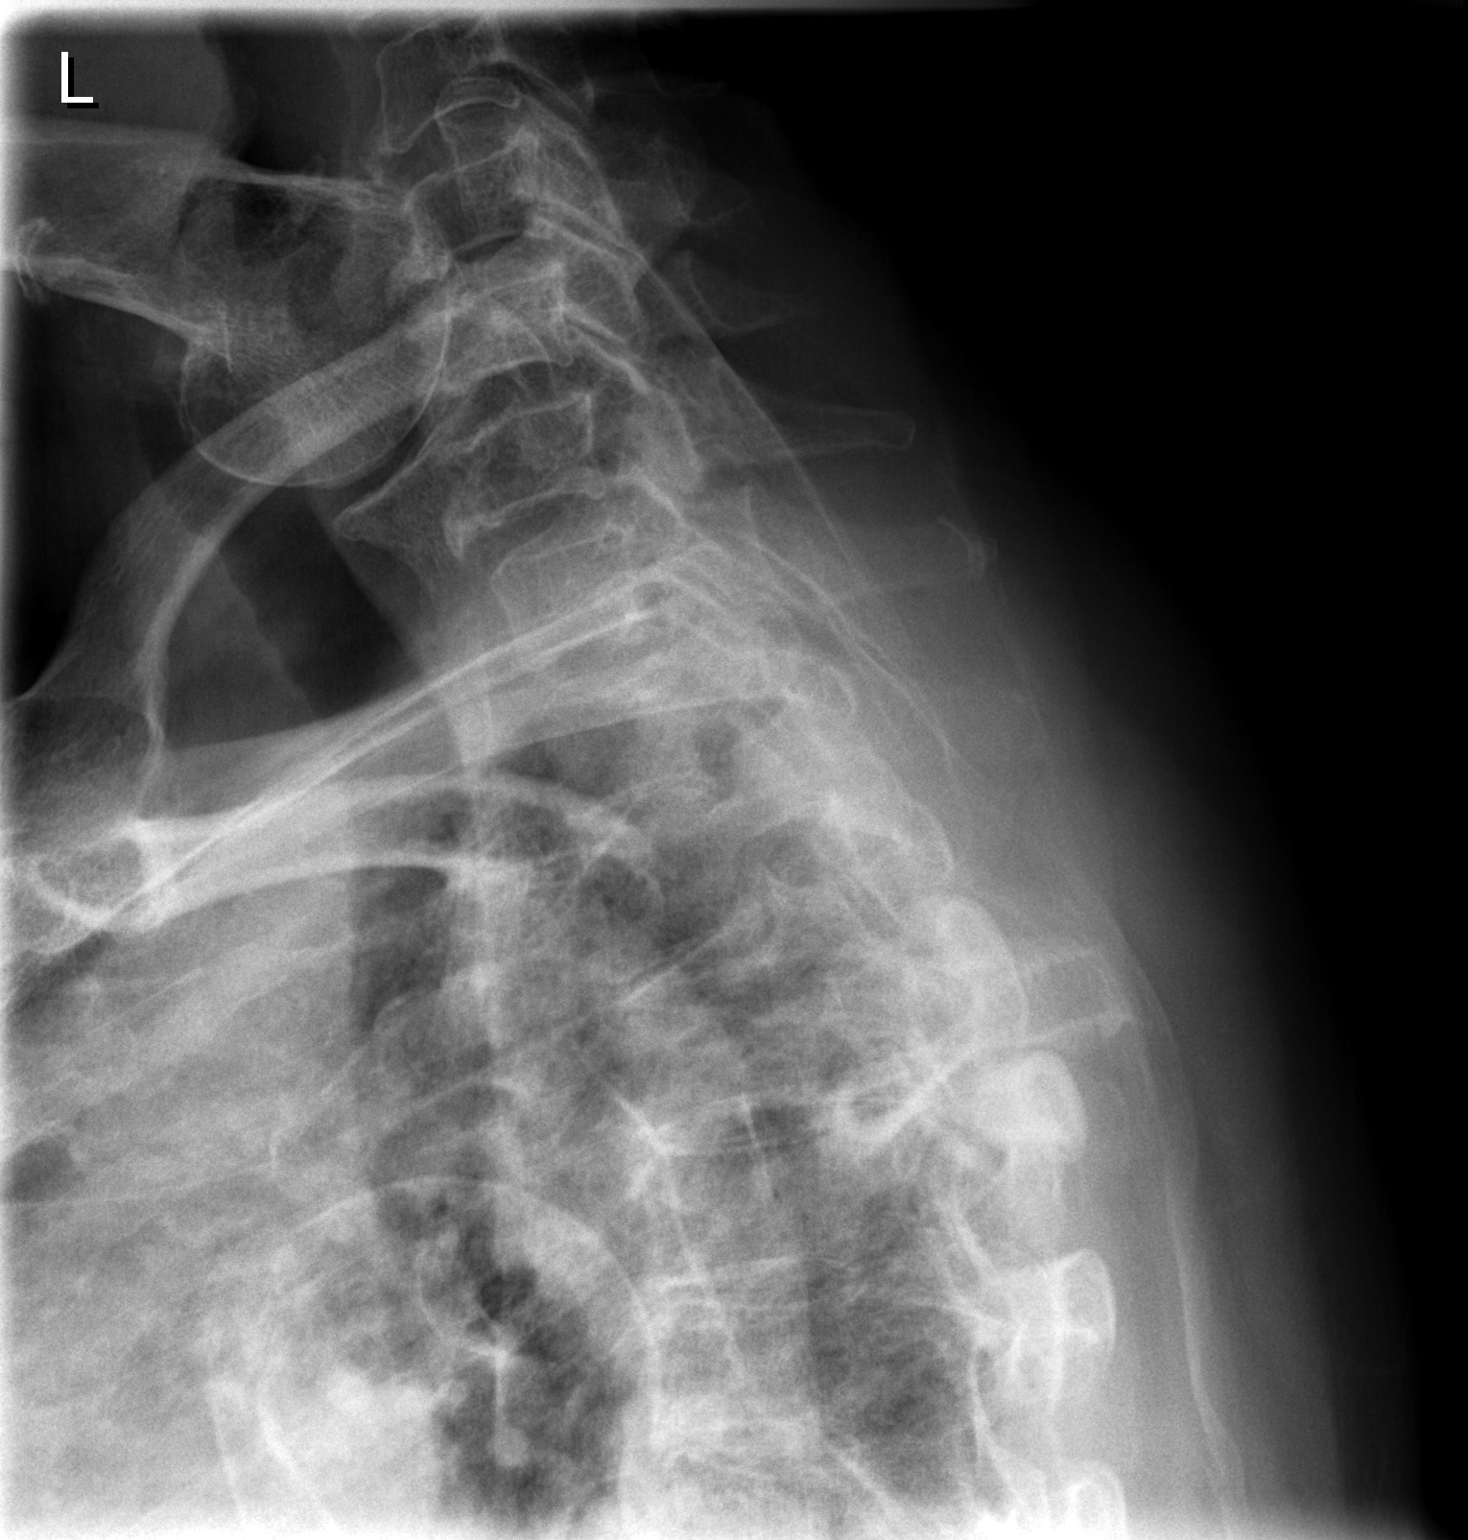

[3 of 3 positions shown; findings below may reference images not displayed]

FINDINGS: No evidence of thoracic spine fracture or subluxation.
Bridging syndesmophyte formation seen throughout the thoracic spine
without disc space narrowing, consistent with diffuse idiopathic
skeletal hyperostosis.  No focal lytic or sclerotic bone lesion
identified.  No evidence of paraspinal mass.
IMPRESSION: No acute findings.  Diffuse idiopathic skeletal hyperostosis
incidentally noted, which is of no clinical significance.

## 2014-05-23 ENCOUNTER — Telehealth: Payer: Self-pay | Admitting: Endocrinology

## 2014-05-23 NOTE — Telephone Encounter (Signed)
Pt is now going to Dr. Ezekiel Ina F# [phone] with [facility]  She said you may have the release she signed

## 2014-05-23 NOTE — Telephone Encounter (Signed)
Pt appt is on 5/26

## 2014-05-23 NOTE — Telephone Encounter (Signed)
Medical release form faxed to medical records department. I notified pt via voicemail.

## 2014-05-25 ENCOUNTER — Telehealth: Payer: Self-pay | Admitting: Oncology

## 2014-05-25 NOTE — Telephone Encounter (Signed)
Per pt to fax medical records to Dr.  Daisey Must in [place]

## 2014-06-10 DIAGNOSIS — J019 Acute sinusitis, unspecified: Secondary | ICD-10-CM | POA: Diagnosis not present

## 2014-06-10 DIAGNOSIS — J309 Allergic rhinitis, unspecified: Secondary | ICD-10-CM | POA: Diagnosis not present

## 2014-06-10 DIAGNOSIS — J45909 Unspecified asthma, uncomplicated: Secondary | ICD-10-CM | POA: Diagnosis not present

## 2014-06-16 DIAGNOSIS — C50919 Malignant neoplasm of unspecified site of unspecified female breast: Secondary | ICD-10-CM | POA: Diagnosis not present

## 2014-06-16 DIAGNOSIS — R208 Other disturbances of skin sensation: Secondary | ICD-10-CM | POA: Diagnosis not present

## 2014-06-16 DIAGNOSIS — E039 Hypothyroidism, unspecified: Secondary | ICD-10-CM | POA: Diagnosis not present

## 2014-06-16 DIAGNOSIS — Z13820 Encounter for screening for osteoporosis: Secondary | ICD-10-CM | POA: Diagnosis not present

## 2014-06-16 DIAGNOSIS — Z780 Asymptomatic menopausal state: Secondary | ICD-10-CM | POA: Diagnosis not present

## 2014-06-16 DIAGNOSIS — R5383 Other fatigue: Secondary | ICD-10-CM | POA: Diagnosis not present

## 2014-07-20 DIAGNOSIS — Z853 Personal history of malignant neoplasm of breast: Secondary | ICD-10-CM | POA: Diagnosis not present

## 2014-09-19 DIAGNOSIS — E039 Hypothyroidism, unspecified: Secondary | ICD-10-CM | POA: Diagnosis not present

## 2014-09-19 DIAGNOSIS — R309 Painful micturition, unspecified: Secondary | ICD-10-CM | POA: Diagnosis not present

## 2014-09-19 DIAGNOSIS — E785 Hyperlipidemia, unspecified: Secondary | ICD-10-CM | POA: Diagnosis not present

## 2014-09-19 DIAGNOSIS — D509 Iron deficiency anemia, unspecified: Secondary | ICD-10-CM | POA: Diagnosis not present

## 2014-09-20 ENCOUNTER — Other Ambulatory Visit: Payer: Self-pay | Admitting: Endocrinology

## 2014-09-27 DIAGNOSIS — Z853 Personal history of malignant neoplasm of breast: Secondary | ICD-10-CM | POA: Diagnosis not present

## 2014-10-19 DIAGNOSIS — C50911 Malignant neoplasm of unspecified site of right female breast: Secondary | ICD-10-CM | POA: Diagnosis not present

## 2014-10-24 DIAGNOSIS — Z23 Encounter for immunization: Secondary | ICD-10-CM | POA: Diagnosis not present

## 2014-11-29 DIAGNOSIS — G4733 Obstructive sleep apnea (adult) (pediatric): Secondary | ICD-10-CM | POA: Diagnosis not present

## 2014-12-07 ENCOUNTER — Telehealth: Payer: Self-pay | Admitting: Pulmonary Disease

## 2014-12-07 NOTE — Telephone Encounter (Signed)
Rec'd request for records from [place], MD faxed 32 pages Sleep Studies and office notes on 12/07/14

## 2014-12-27 DIAGNOSIS — D649 Anemia, unspecified: Secondary | ICD-10-CM | POA: Diagnosis not present

## 2014-12-27 DIAGNOSIS — E785 Hyperlipidemia, unspecified: Secondary | ICD-10-CM | POA: Diagnosis not present

## 2014-12-27 DIAGNOSIS — E039 Hypothyroidism, unspecified: Secondary | ICD-10-CM | POA: Diagnosis not present

## 2014-12-27 DIAGNOSIS — I10 Essential (primary) hypertension: Secondary | ICD-10-CM | POA: Diagnosis not present

## 2014-12-30 DIAGNOSIS — Z0000 Encounter for general adult medical examination without abnormal findings: Secondary | ICD-10-CM | POA: Diagnosis not present

## 2014-12-30 DIAGNOSIS — N390 Urinary tract infection, site not specified: Secondary | ICD-10-CM | POA: Diagnosis not present

## 2015-01-03 DIAGNOSIS — G4733 Obstructive sleep apnea (adult) (pediatric): Secondary | ICD-10-CM | POA: Diagnosis not present

## 2015-01-19 DIAGNOSIS — G4733 Obstructive sleep apnea (adult) (pediatric): Secondary | ICD-10-CM | POA: Diagnosis not present

## 2015-02-09 ENCOUNTER — Encounter: Payer: Self-pay | Admitting: Cardiology

## 2015-06-13 ENCOUNTER — Other Ambulatory Visit: Payer: Self-pay | Admitting: Endocrinology

## 2015-06-13 NOTE — Telephone Encounter (Signed)
Please advise if ok to refill. Last appointment was 04/11/2014.

## 2015-07-20 ENCOUNTER — Other Ambulatory Visit: Payer: Self-pay | Admitting: Endocrinology

## 2015-08-17 ENCOUNTER — Other Ambulatory Visit: Payer: Self-pay | Admitting: Endocrinology
